# Patient Record
Sex: Female | Born: 1975
Health system: Southern US, Community
[De-identification: ages and names within clinical notes are randomized; demographics above are authoritative.]

## PROBLEM LIST (undated history)

## (undated) DIAGNOSIS — N938 Other specified abnormal uterine and vaginal bleeding: Secondary | ICD-10-CM

## (undated) DIAGNOSIS — L709 Acne, unspecified: Secondary | ICD-10-CM

## (undated) DIAGNOSIS — E669 Obesity, unspecified: Secondary | ICD-10-CM

## (undated) DIAGNOSIS — T7840XA Allergy, unspecified, initial encounter: Secondary | ICD-10-CM

## (undated) HISTORY — DX: Obesity, unspecified: E66.9

## (undated) HISTORY — DX: Acne, unspecified: L70.9

## (undated) HISTORY — PX: TYMPANOSTOMY TUBE PLACEMENT: SHX32

## (undated) HISTORY — DX: Other specified abnormal uterine and vaginal bleeding: N93.8

## (undated) HISTORY — PX: TONSILLECTOMY AND ADENOIDECTOMY: SUR1326

## (undated) HISTORY — DX: Allergy, unspecified, initial encounter: T78.40XA

---

## 1999-10-07 ENCOUNTER — Other Ambulatory Visit: Admission: RE | Admit: 1999-10-07 | Discharge: 1999-10-07 | Payer: Self-pay | Admitting: Family Medicine

## 2001-06-15 ENCOUNTER — Other Ambulatory Visit: Admission: RE | Admit: 2001-06-15 | Discharge: 2001-06-15 | Payer: Self-pay | Admitting: Family Medicine

## 2002-11-30 ENCOUNTER — Other Ambulatory Visit: Admission: RE | Admit: 2002-11-30 | Discharge: 2002-11-30 | Payer: Self-pay | Admitting: Family Medicine

## 2003-11-30 ENCOUNTER — Other Ambulatory Visit: Admission: RE | Admit: 2003-11-30 | Discharge: 2003-11-30 | Payer: Self-pay | Admitting: Family Medicine

## 2005-08-05 ENCOUNTER — Ambulatory Visit: Payer: Self-pay | Admitting: Family Medicine

## 2005-08-26 ENCOUNTER — Ambulatory Visit: Payer: Self-pay | Admitting: Family Medicine

## 2005-09-02 ENCOUNTER — Ambulatory Visit: Payer: Self-pay | Admitting: Family Medicine

## 2005-09-05 ENCOUNTER — Ambulatory Visit: Payer: Self-pay | Admitting: Family Medicine

## 2005-10-28 ENCOUNTER — Ambulatory Visit: Payer: Self-pay | Admitting: Family Medicine

## 2005-11-04 ENCOUNTER — Other Ambulatory Visit: Admission: RE | Admit: 2005-11-04 | Discharge: 2005-11-04 | Payer: Self-pay | Admitting: Family Medicine

## 2005-11-04 ENCOUNTER — Encounter: Payer: Self-pay | Admitting: Family Medicine

## 2005-11-04 ENCOUNTER — Ambulatory Visit: Payer: Self-pay | Admitting: Family Medicine

## 2005-11-12 ENCOUNTER — Emergency Department (HOSPITAL_COMMUNITY): Admission: EM | Admit: 2005-11-12 | Discharge: 2005-11-12 | Payer: Self-pay | Admitting: Emergency Medicine

## 2005-11-27 ENCOUNTER — Ambulatory Visit: Payer: Self-pay | Admitting: Family Medicine

## 2007-05-06 ENCOUNTER — Ambulatory Visit: Payer: Self-pay | Admitting: Family Medicine

## 2007-08-18 ENCOUNTER — Telehealth: Payer: Self-pay | Admitting: Family Medicine

## 2007-08-24 ENCOUNTER — Ambulatory Visit: Payer: Self-pay | Admitting: Family Medicine

## 2007-08-24 DIAGNOSIS — L708 Other acne: Secondary | ICD-10-CM | POA: Insufficient documentation

## 2007-08-24 DIAGNOSIS — J309 Allergic rhinitis, unspecified: Secondary | ICD-10-CM | POA: Insufficient documentation

## 2007-08-24 DIAGNOSIS — F411 Generalized anxiety disorder: Secondary | ICD-10-CM | POA: Insufficient documentation

## 2007-10-05 ENCOUNTER — Ambulatory Visit: Payer: Self-pay | Admitting: Family Medicine

## 2010-01-24 ENCOUNTER — Ambulatory Visit: Payer: Self-pay | Admitting: Family Medicine

## 2010-01-24 LAB — CONVERTED CEMR LAB
AST: 13 units/L (ref 0–37)
Alkaline Phosphatase: 74 units/L (ref 39–117)
BUN: 8 mg/dL (ref 6–23)
Bilirubin Urine: NEGATIVE
Blood in Urine, dipstick: NEGATIVE
CO2: 29 meq/L (ref 19–32)
Calcium: 9.2 mg/dL (ref 8.4–10.5)
Direct LDL: 157.3 mg/dL
Eosinophils Absolute: 0.1 10*3/uL (ref 0.0–0.7)
GFR calc non Af Amer: 88.24 mL/min (ref >60)
Glucose, Bld: 93 mg/dL (ref 70–99)
HDL: 34.8 mg/dL — ABNORMAL LOW (ref >39.00)
Hemoglobin: 14.2 g/dL (ref 12.0–15.0)
Ketones, urine, test strip: NEGATIVE
Lymphs Abs: 3.6 10*3/uL (ref 0.7–4.0)
Monocytes Absolute: 0.8 10*3/uL (ref 0.1–1.0)
Monocytes Relative: 6.4 % (ref 3.0–12.0)
Neutrophils Relative %: 62.1 % (ref 43.0–77.0)
RBC: 4.73 M/uL (ref 3.87–5.11)
Specific Gravity, Urine: 1.01
TSH: 0.92 microintl units/mL (ref 0.35–5.50)
Total Bilirubin: 0.4 mg/dL (ref 0.3–1.2)
Triglycerides: 156 mg/dL — ABNORMAL HIGH (ref 0.0–149.0)
Urobilinogen, UA: 0.2
pH: 6.5

## 2010-01-31 ENCOUNTER — Ambulatory Visit: Payer: Self-pay | Admitting: Family Medicine

## 2010-05-15 ENCOUNTER — Telehealth: Payer: Self-pay | Admitting: Internal Medicine

## 2010-05-16 ENCOUNTER — Ambulatory Visit: Payer: Self-pay | Admitting: Family Medicine

## 2010-05-16 DIAGNOSIS — J029 Acute pharyngitis, unspecified: Secondary | ICD-10-CM | POA: Insufficient documentation

## 2010-05-16 DIAGNOSIS — L259 Unspecified contact dermatitis, unspecified cause: Secondary | ICD-10-CM | POA: Insufficient documentation

## 2010-09-19 HISTORY — PX: CARPAL TUNNEL RELEASE: SHX101

## 2010-10-03 ENCOUNTER — Ambulatory Visit
Admission: RE | Admit: 2010-10-03 | Discharge: 2010-10-03 | Payer: Self-pay | Source: Home / Self Care | Attending: Orthopedic Surgery | Admitting: Orthopedic Surgery

## 2010-11-19 NOTE — Assessment & Plan Note (Signed)
Summary: cpx/no pap/njr PT RSC/NJR   Vital Signs:  Patient profile:   35 year old female Height:      68 inches (172.72 cm) Weight:      271 pounds (123.18 kg) BMI:     41.35 Temp:     98.1 degrees F (36.72 degrees C) oral BP sitting:   120 / 80  (left arm) Cuff size:   large  Vitals Entered By: Kern Reap CMA Duncan Dull) (January 31, 2010 10:39 AM) CC: cpx with no pap   CC:  cpx with no pap.  History of Present Illness: Erika Mullins is a 35 year old single female, nonsmoker, who comes in today for physical exam.  Her current weight is 271.  She's lost 17 pounds by going to Weight Watchers.  Her goal weight I think would be reasonably between 160 and 180.  She takes Celexa 30 mg nightly and feels well,  She's also restarted her BCP because of dysfunctional uterine bleeding.  Pap by GYN.  Seasonal flu 2010.   sure 2000 he seasonal flu 2010  Allergies: 1)  ! Doxycycline 2)  ! * Nickel  Past History:  Past medical, surgical, family and social histories (including risk factors) reviewed, and no changes noted (except as noted below).  Past Medical History: Reviewed history from 08/24/2007 and no changes required. adult acne bilateral PE tubes T/a dysfunctional uterine bleeding obesity  Family History: Reviewed history from 10/05/2007 and no changes required. Family History High cholesterol Family History Hypertension obesity  Social History: Reviewed history from 08/24/2007 and no changes required. 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 No apparent alopecia or balding. Eyes:  No corneal or conjunctival inflammation noted. EOMI. Perrla. Funduscopic  exam benign, without hemorrhages, exudates or papilledema. Vision grossly normal. Ears:  External ear exam shows no significant lesions or deformities.  Otoscopic examination reveals clear canals, tympanic membranes are intact bilaterally without bulging, retraction, inflammation or discharge. Hearing is grossly normal bilaterally. Nose:  External nasal examination shows no deformity or inflammation. Nasal mucosa are pink and moist without lesions or exudates. Mouth:  Oral mucosa and oropharynx without lesions or exudates.  Teeth in good repair. Neck:  No deformities, masses, or tenderness noted. Chest Wall:  No deformities, masses, or tenderness noted. Breasts:  No mass, nodules, thickening, tenderness, bulging, retraction, inflamation, nipple discharge or skin changes noted.   Lungs:  Normal respiratory effort, chest expands symmetrically. Lungs are clear to auscultation, no crackles or wheezes. Heart:  Normal rate and regular rhythm. S1 and S2 normal without gallop, murmur, click, rub or other extra sounds. Abdomen:  Bowel sounds positive,abdomen soft and non-tender without masses, organomegaly or hernias noted. Msk:  No deformity or scoliosis noted of thoracic or lumbar spine.   Pulses:  R and L carotid,radial,femoral,dorsalis pedis and posterior tibial pulses are full and equal bilaterally Extremities:  No clubbing, cyanosis, edema, or deformity noted with normal full range of motion of all joints.   Neurologic:  No cranial nerve deficits noted. Station and gait are normal. Plantar reflexes are down-going bilaterally. DTRs are symmetrical throughout. Sensory, motor and coordinative functions appear intact. Skin:  Intact without suspicious lesions or rashes.Marland KitchenMarland KitchenMarland KitchenMarland Kitchenpicularis r/l arms Cervical Nodes:  No lymphadenopathy noted Axillary Nodes:  No palpable lymphadenopathy  Inguinal Nodes:  No significant adenopathy Psych:  Cognition and judgment appear intact. Alert and cooperative with normal  attention span and concentration. No apparent delusions, illusions, hallucinations   Impression & Recommendations:  Problem # 1:  Preventive Health Care (ICD-V70.0) Assessment Unchanged  Problem # 2:  ANXIETY STATE, UNSPECIFIED (ICD-300.00) Assessment: Improved  Her updated medication list for this problem includes:    Celexa 20 Mg Tabs (Citalopram hydrobromide) .Marland Kitchen... 0ne and 1/2 qhs  Complete Medication List: 1)  Celexa 20 Mg Tabs (Citalopram hydrobromide) .... 0ne and 1/2 qhs 2)  Septra Ds 800-160 Mg Tabs (Sulfamethoxazole-trimethoprim) .Marland Kitchen.. 1tab  at bedtime for acne 3)  Zovia 1/35e (28) 1-35 Mg-mcg Tabs (Ethynodiol diac-eth estradiol) .... Take 1 tablet by mouth once a day 4)  Mircette 0.15-0.02/0.01 Mg (21/5) Tabs (Desogestrel-ethinyl estradiol)  Patient Instructions: 1)  continue current medication.  Return in one year for follow-up Prescriptions: CELEXA 20 MG  TABS (CITALOPRAM HYDROBROMIDE) 0ne and 1/2 qhs  #150 x 3   Entered and Authorized by:   Roderick Pee MD   Signed by:   Roderick Pee MD on 01/31/2010   Method used:   Electronically to        [facility] [address]       [address]       [place], [place]  [address]       Ph: [phone]       Fax: [phone]   RxID:   [id number]

## 2010-11-19 NOTE — Assessment & Plan Note (Signed)
Summary: sore throat - rv   Vital Signs:  Patient profile:   35 year old female Temp:     98.0 degrees F oral BP sitting:   110 / 70  (left arm) Cuff size:   large  Vitals Entered By: Sid Falcon LPN (May 16, 2010 1:44 PM) CC: body aches, swollen glands   History of Present Illness: Patient seen with sore throat and bodyaches for the past week. She has some diffuse mild headaches. No fever. Minimal nasal congestion. No cough. She has not taken anything for symptomatic relief.  Patient also mentions recurrent pruritic rash left wrist. History of allergies to nickel. She has used steroid cream the past and requests refill  Allergies: 1)  ! Doxycycline 2)  ! * Nickel  Past History:  Past Medical History: Last updated: 08/24/2007 adult acne bilateral PE tubes T/a dysfunctional uterine bleeding obesity PMH reviewed for relevance  Review of Systems      See HPI  Physical Exam  General:  Well-developed,well-nourished,in no acute distress; alert,appropriate and cooperative throughout examination Ears:  External ear exam shows no significant lesions or deformities.  Otoscopic examination reveals clear canals, tympanic membranes are intact bilaterally without bulging, retraction, inflammation or discharge. Hearing is grossly normal bilaterally. Nose:  External nasal examination shows no deformity or inflammation. Nasal mucosa are pink and moist without lesions or exudates. Mouth:  patient has a few small scattered aphthous type ulcers posterior pharynx with mild erythema. No significant exudate Neck:  supple with minimal anterior cervical adenopathy Lungs:  Normal respiratory effort, chest expands symmetrically. Lungs are clear to auscultation, no crackles or wheezes. Heart:  Normal rate and regular rhythm. S1 and S2 normal without gallop, murmur, click, rub or other extra sounds. Skin:  patient has nonspecific small erythematous patch left wrist which is nonscaly.  Nonvesicular. Nontender   Impression & Recommendations:  Problem # 1:  SORE THROAT (ICD-462) Assessment New rapid strep is neg. Suspect viral .  treat symptomatically. The following medications were removed from the medication list:    Septra Ds 800-160 Mg Tabs (Sulfamethoxazole-trimethoprim) .Marland Kitchen... 1tab  at bedtime for acne  Orders: Rapid Strep ([address])  Problem # 2:  DERMATITIS, ALLERGIC (ICD-692.9) steroid cream. Her updated medication list for this problem includes:    Triamcinolone Acetonide 0.1 % Crea (Triamcinolone acetonide) .Marland Kitchen... Apply to affected rash two times a day prn  Complete Medication List: 1)  Celexa 20 Mg Tabs (Citalopram hydrobromide) .... 0ne and 1/2 qhs 2)  Mircette 0.15-0.02/0.01 Mg (21/5) Tabs (Desogestrel-ethinyl estradiol) 3)  Triamcinolone Acetonide 0.1 % Crea (Triamcinolone acetonide) .... Apply to affected rash two times a day prn  Patient Instructions: 1)  Treat sore throat symptomatically with Advil, Alleve, or Tylenol. Prescriptions: TRIAMCINOLONE ACETONIDE 0.1 % CREA (TRIAMCINOLONE ACETONIDE) apply to affected rash two times a day prn  #30 gm x 1   Entered and Authorized by:   Evelena Peat MD   Signed by:   Evelena Peat MD on 05/16/2010   Method used:   Electronically to        [facility]       [address]       [place], [place]  [address]       Ph: [phone]       Fax: [phone]   RxID:   [id number]

## 2010-11-19 NOTE — Progress Notes (Signed)
Summary: pt wants nurse to call back re: symptoms  Phone Note Call from Patient Call back at [phone] cell   Caller: Patient Summary of Call: Pt called and said that her gland on neck are really sore and so is her throat. Pt is fatigued. Pt req to talk to Dr Barbette Or nurse.      Initial call taken by: Lucy Antigua,  May 15, 2010 4:42 PM  Follow-up for Phone Call        patient coming in for an office visit Follow-up by: Kern Reap CMA Duncan Dull),  May 16, 2010 8:06 AM

## 2010-12-30 LAB — POCT HEMOGLOBIN-HEMACUE: Hemoglobin: 15 g/dL (ref 12.0–15.0)

## 2011-01-16 ENCOUNTER — Other Ambulatory Visit: Payer: Self-pay

## 2011-01-23 ENCOUNTER — Ambulatory Visit: Payer: Self-pay | Admitting: Family Medicine

## 2011-02-21 ENCOUNTER — Other Ambulatory Visit: Payer: Self-pay | Admitting: *Deleted

## 2011-02-21 MED ORDER — CITALOPRAM HYDROBROMIDE 20 MG PO TABS: 20.0000 mg | ORAL_TABLET | Freq: Every day | ORAL | Status: DC

## 2011-03-06 ENCOUNTER — Other Ambulatory Visit: Payer: Self-pay

## 2011-03-27 ENCOUNTER — Ambulatory Visit: Payer: Self-pay | Admitting: Family Medicine

## 2011-04-08 ENCOUNTER — Other Ambulatory Visit: Payer: Self-pay

## 2011-04-15 ENCOUNTER — Ambulatory Visit: Payer: Self-pay | Admitting: Family Medicine

## 2011-05-21 ENCOUNTER — Other Ambulatory Visit: Payer: Self-pay

## 2011-06-03 ENCOUNTER — Ambulatory Visit: Payer: Self-pay | Admitting: Family Medicine

## 2011-06-30 ENCOUNTER — Ambulatory Visit (INDEPENDENT_AMBULATORY_CARE_PROVIDER_SITE_OTHER): Payer: 59 | Admitting: Family Medicine

## 2011-06-30 ENCOUNTER — Encounter: Payer: Self-pay | Admitting: Family Medicine

## 2011-06-30 VITALS — Temp 97.5°F | Wt 286.0 lb

## 2011-06-30 DIAGNOSIS — Z0000 Encounter for general adult medical examination without abnormal findings: Secondary | ICD-10-CM

## 2011-06-30 DIAGNOSIS — E669 Obesity, unspecified: Secondary | ICD-10-CM

## 2011-06-30 DIAGNOSIS — M549 Dorsalgia, unspecified: Secondary | ICD-10-CM

## 2011-06-30 DIAGNOSIS — Z23 Encounter for immunization: Secondary | ICD-10-CM

## 2011-06-30 MED ORDER — DESOGESTREL-ETHINYL ESTRADIOL 0.15-0.02/0.01 MG (21/5) PO TABS: 1.0000 | ORAL_TABLET | Freq: Every day | ORAL | Status: DC

## 2011-06-30 MED ORDER — TRAMADOL HCL 50 MG PO TABS: ORAL_TABLET | ORAL | Status: DC

## 2011-06-30 MED ORDER — CYCLOBENZAPRINE HCL 5 MG PO TABS: ORAL_TABLET | ORAL | Status: DC

## 2011-06-30 NOTE — Patient Instructions (Signed)
Take 600 mg of Motrin twice daily.  Flexeril and ultram........... One half to one tablet each at bedtime as needed for severe pain.  Begin Weight Watchers.  We will get she is set up to see Jeanene Erb, physical therapist.  Return in 3 weeks for a 30 minute appointment

## 2011-06-30 NOTE — Progress Notes (Signed)
  Subjective:    Patient ID: Erika Mullins, female    DOB: Mar 23, 1976, 35 y.o.   MRN: [id number]  HPI Erika Mullins is a 35 year old single female, nonsmoker, who comes in today for evaluation of low back pain.  She states a week ago.  She awoke with low back pain.  She describes it as a sharp pain.  A6 on a scale of one to 10 it radiates down her posterior right thigh just above the knee.  It's increased by sitting.  The pain does somewhat come and go.  She's never had a history of trauma.  She is overweight 286 pounds.   Review of Systems General and neurologic review of systems otherwise negative    Objective:   Physical Exam  Well-developed well-nourished, female, overweight, in no acute distress.  Examination of lower extremities shows the legs to be of equal length.  Sensation muscle strength reflexes.  Straight leg raising.  All negative.      Assessment & Plan:  Lumbar disk disease.  Plan diet, exercise, physical therapy, Motrin, Flexeril, and Vicodin nightly p.r.n. Follow-up CPX in 3 weeks

## 2011-07-23 ENCOUNTER — Encounter: Payer: Self-pay | Admitting: Family Medicine

## 2011-07-24 ENCOUNTER — Other Ambulatory Visit (HOSPITAL_COMMUNITY)
Admission: RE | Admit: 2011-07-24 | Discharge: 2011-07-24 | Disposition: A | Discharge status: Home / Self Care | Payer: 59 | Source: Ambulatory Visit | Attending: Family Medicine | Admitting: Family Medicine

## 2011-07-24 ENCOUNTER — Ambulatory Visit (INDEPENDENT_AMBULATORY_CARE_PROVIDER_SITE_OTHER): Payer: 59 | Admitting: Family Medicine

## 2011-07-24 ENCOUNTER — Encounter: Payer: Self-pay | Admitting: Family Medicine

## 2011-07-24 DIAGNOSIS — E669 Obesity, unspecified: Secondary | ICD-10-CM

## 2011-07-24 DIAGNOSIS — Z01419 Encounter for gynecological examination (general) (routine) without abnormal findings: Secondary | ICD-10-CM | POA: Insufficient documentation

## 2011-07-24 DIAGNOSIS — M549 Dorsalgia, unspecified: Secondary | ICD-10-CM

## 2011-07-24 DIAGNOSIS — Z0000 Encounter for general adult medical examination without abnormal findings: Secondary | ICD-10-CM

## 2011-07-24 DIAGNOSIS — F411 Generalized anxiety disorder: Secondary | ICD-10-CM

## 2011-07-24 LAB — LIPID PANEL: HDL: 45.1 mg/dL (ref >39.00)

## 2011-07-24 LAB — BASIC METABOLIC PANEL
BUN: 10 mg/dL (ref 6–23)
CO2: 23 mEq/L (ref 19–32)
Calcium: 9 mg/dL (ref 8.4–10.5)
Creatinine, Ser: 0.7 mg/dL (ref 0.4–1.2)
GFR: 103.7 mL/min (ref >60.00)
Glucose, Bld: 100 mg/dL — ABNORMAL HIGH (ref 70–99)
Sodium: 138 mEq/L (ref 135–145)

## 2011-07-24 LAB — CBC WITH DIFFERENTIAL/PLATELET
Eosinophils Absolute: 0.2 10*3/uL (ref 0.0–0.7)
HCT: 39.5 % (ref 36.0–46.0)
Hemoglobin: 13.2 g/dL (ref 12.0–15.0)
Lymphs Abs: 3.1 10*3/uL (ref 0.7–4.0)
Monocytes Absolute: 0.6 10*3/uL (ref 0.1–1.0)
Neutro Abs: 6.8 10*3/uL (ref 1.4–7.7)
Platelets: 508 10*3/uL — ABNORMAL HIGH (ref 150.0–400.0)
RBC: 4.55 Mil/uL (ref 3.87–5.11)
RDW: 12.9 % (ref 11.5–14.6)

## 2011-07-24 LAB — HEPATIC FUNCTION PANEL
ALT: 13 U/L (ref 0–35)
Albumin: 3.8 g/dL (ref 3.5–5.2)
Bilirubin, Direct: 0 mg/dL (ref 0.0–0.3)

## 2011-07-24 MED ORDER — NORETHIN-ETH ESTRAD TRIPHASIC 0.5/0.75/1-35 MG-MCG PO TABS: 1.0000 | ORAL_TABLET | Freq: Every day | ORAL | Status: DC

## 2011-07-24 MED ORDER — TRAMADOL HCL 50 MG PO TABS: ORAL_TABLET | ORAL | Status: DC

## 2011-07-24 MED ORDER — CITALOPRAM HYDROBROMIDE 20 MG PO TABS: 20.0000 mg | ORAL_TABLET | Freq: Every day | ORAL | Status: DC

## 2011-07-24 MED ORDER — TRIAMCINOLONE ACETONIDE 0.1 % EX CREA: TOPICAL_CREAM | Freq: Two times a day (BID) | CUTANEOUS | Status: DC

## 2011-07-24 MED ORDER — CYCLOBENZAPRINE HCL 5 MG PO TABS: ORAL_TABLET | ORAL | Status: DC

## 2011-07-24 NOTE — Progress Notes (Signed)
Addended by: Kern Reap B on: 07/24/2011 11:53 AM   Modules accepted: Orders

## 2011-07-24 NOTE — Patient Instructions (Signed)
Continue your current medications.  Consider a walking program............ Weight Watchers........[facility] Any program the might be valuable to help with your weight.  Return in one year, sooner if any problems

## 2011-07-24 NOTE — Progress Notes (Signed)
  Subjective:    Patient ID: Erika Mullins, female    DOB: 20-Dec-1975, 35 y.o.   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  Doing well.  She is currently taking Flexeril and Ultram at bedtime for back pain.  She is doing physical therapy at home.  She uses BCPs for dysfunction uterine bleeding.  LMP 94 normal.  Weight is 285 pounds.  We have previously discussed diet, exercise and weight loss.  She gets routine eye care, dental care, BSE monthly, tetanus, 2012, flu shot at work   Review of Systems  Constitutional: Negative.   HENT: Negative.   Eyes: Negative.   Respiratory: Negative.   Cardiovascular: Negative.   Gastrointestinal: Negative.   Genitourinary: Negative.   Musculoskeletal: Negative.   Neurological: Negative.   Hematological: Negative.   Psychiatric/Behavioral: Negative.        Objective:   Physical Exam  Constitutional: She appears well-developed and well-nourished.  HENT:  Head: Normocephalic and atraumatic.  Right Ear: External ear normal.  Left Ear: External ear normal.  Nose: Nose normal.  Mouth/Throat: Oropharynx is clear and moist.  Eyes: EOM are normal. Pupils are equal, round, and reactive to light.  Neck: Normal range of motion. Neck supple. No thyromegaly present.  Cardiovascular: Normal rate, regular rhythm, normal heart sounds and intact distal pulses.  Exam reveals no gallop and no friction rub.   No murmur heard. Pulmonary/Chest: Effort normal and breath sounds normal.  Abdominal: Soft. Bowel sounds are normal. She exhibits no distension and no mass. There is no tenderness. There is no rebound.  Genitourinary: Vagina normal and uterus normal. No vaginal discharge found.       Bilateral breast exam normal  Musculoskeletal: Normal range of motion.    Lymphadenopathy:    She has no cervical adenopathy.  Neurological: She is alert. She has normal reflexes. No cranial nerve deficit. She exhibits normal muscle tone. Coordination normal.  Skin: Skin is warm and dry.  Psychiatric: She has a normal mood and affect. Her behavior is normal. Judgment and thought content normal.          Assessment & Plan:  Healthy female.  Mild depression continue Celexa 20 mg nightly  Low back pain continue medication and exercise.  Obesity recommended again trying Weight Watchers.  Dysfunction uterine bleeding.  Continue BCPs

## 2011-12-01 ENCOUNTER — Ambulatory Visit (INDEPENDENT_AMBULATORY_CARE_PROVIDER_SITE_OTHER): Payer: 59 | Admitting: Family Medicine

## 2011-12-01 ENCOUNTER — Encounter: Payer: Self-pay | Admitting: Family Medicine

## 2011-12-01 VITALS — BP 110/80 | Temp 98.0°F | Wt 284.0 lb

## 2011-12-01 DIAGNOSIS — J309 Allergic rhinitis, unspecified: Secondary | ICD-10-CM

## 2011-12-01 MED ORDER — FLUTICASONE PROPIONATE 50 MCG/ACT NA SUSP: NASAL | Status: DC

## 2011-12-01 MED ORDER — PREDNISONE 20 MG PO TABS: ORAL_TABLET | ORAL | Status: DC

## 2011-12-01 NOTE — Patient Instructions (Signed)
Take the prednisone as directed  When u  Finish  the prednisone begin plain Zyrtec 10 mg at bedtime along with one shot of the steroid nasal spray at bedtime  Remember to do all the environmental things that we discussed

## 2011-12-01 NOTE — Progress Notes (Signed)
  Subjective:    Patient ID: Erika Mullins, female    DOB: Jan 07, 1976, 36 y.o.   MRN: [id number]  HPI Who comes in today for evaluation of allergic rhinitis  For the past 2 weeks she said fatigue no energy her condition postnasal drip no cough no wheezing.  At home she has dogs that sleep in her bed she also has a feather pillow and will blankets and a down comforter     Review of Systems General and ENT review of systems otherwise negative    Objective:   Physical Exam Well-developed well-nourished female in no acute distress HEENT negative neck was supple no adenopathy lungs are clear       Assessment & Plan:  Allergic rhinitis plan short course of prednisone then Zyrtec and steroid nasal spray each bedtime also discussed eliminating potential environmental triggers

## 2012-07-04 ENCOUNTER — Other Ambulatory Visit: Payer: Self-pay | Admitting: Family Medicine

## 2012-07-05 ENCOUNTER — Telehealth: Payer: Self-pay | Admitting: Family Medicine

## 2012-07-05 NOTE — Telephone Encounter (Signed)
Patient called stating that she need a refill of her norethindrone-ethinyl estradiol called into [facility] [address]. Please assist.

## 2012-07-05 NOTE — Telephone Encounter (Signed)
Rx already sent.

## 2012-07-06 ENCOUNTER — Telehealth: Payer: Self-pay | Admitting: Family Medicine

## 2012-07-06 MED ORDER — ORTHO-NOVUM 7/7/7 (28) 0.5/0.75/1-35 MG-MCG PO TABS: 1.0000 | ORAL_TABLET | Freq: Every day | ORAL | Status: DC

## 2012-07-06 NOTE — Telephone Encounter (Signed)
rx sent

## 2012-07-06 NOTE — Telephone Encounter (Signed)
Patient called stating that per [facility] [address] she has to have the brand name birth control in order for it to be free it has to state MD prefer name brand. Please assist.

## 2012-07-19 ENCOUNTER — Ambulatory Visit (INDEPENDENT_AMBULATORY_CARE_PROVIDER_SITE_OTHER): Payer: 59 | Admitting: Family Medicine

## 2012-07-19 ENCOUNTER — Encounter: Payer: Self-pay | Admitting: Family Medicine

## 2012-07-19 VITALS — BP 110/80 | Temp 98.1°F | Wt 286.0 lb

## 2012-07-19 DIAGNOSIS — T148XXA Other injury of unspecified body region, initial encounter: Secondary | ICD-10-CM

## 2012-07-19 DIAGNOSIS — IMO0002 Reserved for concepts with insufficient information to code with codable children: Secondary | ICD-10-CM

## 2012-07-19 NOTE — Patient Instructions (Signed)
Wear a sterile dressing daily  Cotton socks and sneakers  Return when necessary

## 2012-07-19 NOTE — Progress Notes (Signed)
  Subjective:    Patient ID: Erika Mullins, female    DOB: 1976-07-13, 36 y.o.   MRN: [id number]  HPI Erika Mullins is a 36 year old female who comes in today for evaluation of an abrasion on the dorsum of her right foot x5 days     Review of Systems    general and dermatologic review of systems negative Objective:   Physical Exam Well-developed and nourished female no acute distress examination of the foot shows a dime size abrasion dorsum of the foot just proximal to the great toe not infected       Assessment & Plan:  Abrasion right foot advised symptomatic therapy

## 2012-08-24 ENCOUNTER — Other Ambulatory Visit: Payer: 59

## 2012-08-31 ENCOUNTER — Encounter: Payer: 59 | Admitting: Family Medicine

## 2012-09-14 ENCOUNTER — Other Ambulatory Visit (INDEPENDENT_AMBULATORY_CARE_PROVIDER_SITE_OTHER): Payer: 59

## 2012-09-14 DIAGNOSIS — Z0000 Encounter for general adult medical examination without abnormal findings: Secondary | ICD-10-CM

## 2012-09-14 LAB — CBC WITH DIFFERENTIAL/PLATELET
Basophils Absolute: 0 10*3/uL (ref 0.0–0.1)
Basophils Relative: 0.3 % (ref 0.0–3.0)
HCT: 40.4 % (ref 36.0–46.0)
Hemoglobin: 13.3 g/dL (ref 12.0–15.0)
MCV: 86.8 fl (ref 78.0–100.0)
Monocytes Absolute: 0.9 10*3/uL (ref 0.1–1.0)
Monocytes Relative: 6.3 % (ref 3.0–12.0)
Neutro Abs: 8.2 10*3/uL — ABNORMAL HIGH (ref 1.4–7.7)

## 2012-09-14 LAB — HEPATIC FUNCTION PANEL
ALT: 12 U/L (ref 0–35)
AST: 15 U/L (ref 0–37)
Alkaline Phosphatase: 76 U/L (ref 39–117)
Bilirubin, Direct: 0 mg/dL (ref 0.0–0.3)
Total Bilirubin: 0.6 mg/dL (ref 0.3–1.2)

## 2012-09-14 LAB — POCT URINALYSIS DIPSTICK
Bilirubin, UA: NEGATIVE
Blood, UA: NEGATIVE
Glucose, UA: NEGATIVE
Nitrite, UA: NEGATIVE
Urobilinogen, UA: 0.2

## 2012-09-14 LAB — BASIC METABOLIC PANEL
Creatinine, Ser: 0.7 mg/dL (ref 0.4–1.2)
GFR: 103 mL/min (ref >60.00)
Glucose, Bld: 104 mg/dL — ABNORMAL HIGH (ref 70–99)
Potassium: 4.3 mEq/L (ref 3.5–5.1)

## 2012-09-14 LAB — LIPID PANEL: VLDL: 57 mg/dL — ABNORMAL HIGH (ref 0.0–40.0)

## 2012-09-14 LAB — LDL CHOLESTEROL, DIRECT: Direct LDL: 163.6 mg/dL

## 2012-09-17 ENCOUNTER — Encounter: Payer: Self-pay | Admitting: Family Medicine

## 2012-09-17 ENCOUNTER — Other Ambulatory Visit (HOSPITAL_COMMUNITY)
Admission: RE | Admit: 2012-09-17 | Discharge: 2012-09-17 | Disposition: A | Discharge status: Home / Self Care | Payer: 59 | Source: Ambulatory Visit | Attending: Family Medicine | Admitting: Family Medicine

## 2012-09-17 ENCOUNTER — Ambulatory Visit (INDEPENDENT_AMBULATORY_CARE_PROVIDER_SITE_OTHER): Payer: 59 | Admitting: Family Medicine

## 2012-09-17 VITALS — BP 124/80 | Temp 97.3°F | Ht 68.0 in | Wt 288.0 lb

## 2012-09-17 DIAGNOSIS — E669 Obesity, unspecified: Secondary | ICD-10-CM

## 2012-09-17 DIAGNOSIS — Z01419 Encounter for gynecological examination (general) (routine) without abnormal findings: Secondary | ICD-10-CM

## 2012-09-17 DIAGNOSIS — L259 Unspecified contact dermatitis, unspecified cause: Secondary | ICD-10-CM

## 2012-09-17 DIAGNOSIS — F411 Generalized anxiety disorder: Secondary | ICD-10-CM

## 2012-09-17 DIAGNOSIS — F419 Anxiety disorder, unspecified: Secondary | ICD-10-CM | POA: Insufficient documentation

## 2012-09-17 DIAGNOSIS — M549 Dorsalgia, unspecified: Secondary | ICD-10-CM

## 2012-09-17 MED ORDER — CITALOPRAM HYDROBROMIDE 20 MG PO TABS: 20.0000 mg | ORAL_TABLET | Freq: Every day | ORAL | Status: DC

## 2012-09-17 MED ORDER — ORTHO-NOVUM 7/7/7 (28) 0.5/0.75/1-35 MG-MCG PO TABS: 1.0000 | ORAL_TABLET | Freq: Every day | ORAL | Status: DC

## 2012-09-17 MED ORDER — TRIAMCINOLONE ACETONIDE 0.1 % EX CREA: TOPICAL_CREAM | Freq: Two times a day (BID) | CUTANEOUS | Status: DC

## 2012-09-17 NOTE — Patient Instructions (Signed)
Continue your current medications  Return in one year sooner if any problems 

## 2012-09-17 NOTE — Progress Notes (Signed)
  Subjective:    Patient ID: Erika Mullins, female    DOB: [birth year], 36 y.o.   MRN: [id number]  HPI Erika Mullins is a 36 year old single female nonsmoker who comes in today for annual physical examination  She takes 20 mg of Celexa at bedtime for a history of mild anxiety  She takes her BCPs periods are normal  He's is Kenalog when necessary for eczema  She states she's had a good year no problems. Tetanus booster 2012 seasonal flu shot 2013. She does have a history of fibrocystic breast changes and does BSE monthly   Review of Systems  Constitutional: Negative.   HENT: Negative.   Eyes: Negative.   Respiratory: Negative.   Cardiovascular: Negative.   Gastrointestinal: Negative.   Genitourinary: Negative.   Musculoskeletal: Negative.   Neurological: Negative.   Hematological: Negative.   Psychiatric/Behavioral: Negative.       general and metabolic review of systems otherwise negative Objective:   Physical Exam  Constitutional: She appears well-developed and well-nourished.  HENT:  Head: Normocephalic and atraumatic.  Right Ear: External ear normal.  Left Ear: External ear normal.  Nose: Nose normal.  Mouth/Throat: Oropharynx is clear and moist.  Eyes: EOM are normal. Pupils are equal, round, and reactive to light.  Neck: Normal range of motion. Neck supple. No thyromegaly present.  Cardiovascular: Normal rate, regular rhythm, normal heart sounds and intact distal pulses.  Exam reveals no gallop and no friction rub.   No murmur heard. Pulmonary/Chest: Effort normal and breath sounds normal.  Abdominal: Soft. Bowel sounds are normal. She exhibits no distension and no mass. There is no tenderness. There is no rebound.  Genitourinary: Vagina normal and uterus normal. Guaiac negative stool. No vaginal discharge found.       Bilateral breast exam normal except for multiple small BB size soft rubbery movable fibrocystic changes  Musculoskeletal: Normal range of motion.    Lymphadenopathy:    She has no cervical adenopathy.  Neurological: She is alert. She has normal reflexes. No cranial nerve deficit. She exhibits normal muscle tone. Coordination normal.  Skin: Skin is warm and dry.  Psychiatric: She has a normal mood and affect. Her behavior is normal. Judgment and thought content normal.          Assessment & Plan:  Healthy female  Continue Celexa  Continue BCPs  Continue triamcinolone when necessary  Diet and exercise

## 2012-10-05 ENCOUNTER — Ambulatory Visit: Payer: 59 | Admitting: *Deleted

## 2012-10-08 ENCOUNTER — Other Ambulatory Visit: Payer: 59

## 2012-10-18 ENCOUNTER — Encounter: Payer: 59 | Admitting: Family Medicine

## 2012-10-26 ENCOUNTER — Encounter: Payer: 59 | Attending: Family Medicine | Admitting: *Deleted

## 2012-10-26 ENCOUNTER — Encounter: Payer: Self-pay | Admitting: *Deleted

## 2012-10-26 VITALS — Ht 69.0 in | Wt 289.2 lb

## 2012-10-26 DIAGNOSIS — Z713 Dietary counseling and surveillance: Secondary | ICD-10-CM | POA: Insufficient documentation

## 2012-10-26 DIAGNOSIS — E669 Obesity, unspecified: Secondary | ICD-10-CM

## 2012-10-26 NOTE — Progress Notes (Signed)
  Medical Nutrition Therapy:  Appt start time: 1200 end time:  1300.  Assessment:: Obesity (278.00).  Erika Mullins comes today for MNT and help with weight loss. Dietary recall reveals excessive calories, CHO, and fat intake through sweets, starches, and meals away from home. Has not exercised recently d/t illness.  States she knows what she needs to do, but just needs a plan.    MEDICATIONS: Celexa, BCP, Kenalog   TANITA  BODY COMPOSITION RESULTS  10/26/12   BMI (kg/m^2) 42.8   Fat Mass (lbs) 159.0   Fat Free Mass (lbs) 131.0   Total Body Water (lbs) 96.0   DIETARY INTAKE:  Usual eating pattern includes 3 meals and 0-1 snacks per day.  24-hr recall:  B (8:30 AM): [employer] Bacon, egg, gouda sandwich w/ skinny hazelnut latte  Snk (AM): NONE L (12 PM): Chili w/ cheese, large banana, a snowball (choc & coconut sweet); water Snk (PM): Chips or candy bar (2-3 days/week) D (PM): Chipotle chicken & rice bowl w/ sour cream, guac, and chips Snk (PM): 1 pc of candy or lg glass of wine (3-4x/week) Beverages: Water, coffee, wine, sometimes regular soda   Usual physical activity:  Was exercising at gym 3 times/week for 2 months; haven't recently d/t illness.  60 min tap class on Wed nights   Estimated daily energy needs: 1500-1600 calories (maint calories = 2000-2100) 185-200 g carbohydrates 70-90 g protein 40-50 g fat  Progress Towards Goal(s):  In progress.   Nutritional Diagnosis:  [place]-3.3 Overweight/obesity related to poor eating habits as evidenced by BMI of 42.7 kg/m^2 and MD referral for MNT.    Intervention:  Nutrition education including CHO metabolism, CHO counting, and meal planning.   Handouts given during visit include:  Meal Planning Card  Low CHO Snack List  30g and 45g CHO Meal Plan  Goals:  Eat 3 meals/day, Avoid meal skipping   Have lean protein with ALL carbohydrates  Limit carbohydrates to 3 "choices" or 45g per meal and 1 "choice" or 15g per snack  Choose  more whole grains, lean protein, low-fat dairy, and fruits/non-starchy vegetables  Aim for >30 min of physical activity daily  Limit/avoid sugar-sweetened beverages and concentrated sweets  Limit alcohol intake to avoid extra calories  Monitoring/Evaluation:  Dietary intake, exercise, and body weight in 2 week(s).

## 2012-10-27 ENCOUNTER — Encounter: Payer: Self-pay | Admitting: *Deleted

## 2012-10-27 NOTE — Patient Instructions (Addendum)
Goals:  Eat 3 meals/day, Avoid meal skipping  Have lean protein with ALL carbohydrates  Limit carbohydrates to 3 "choices" or 45g per meal and 1 "choice" or 15g per snack.    Choose more whole grains, lean protein, low-fat dairy, and fruits/non-starchy vegetables.   Aim for >30 min of physical activity daily  Limit/avoid sugar-sweetened beverages and concentrated sweets  Limit alcohol intake to avoid extra calories.

## 2012-11-22 ENCOUNTER — Encounter: Payer: 59 | Attending: Family Medicine | Admitting: *Deleted

## 2012-11-22 ENCOUNTER — Encounter: Payer: Self-pay | Admitting: *Deleted

## 2012-11-22 VITALS — Ht 69.0 in | Wt 290.5 lb

## 2012-11-22 DIAGNOSIS — E669 Obesity, unspecified: Secondary | ICD-10-CM | POA: Insufficient documentation

## 2012-11-22 DIAGNOSIS — Z713 Dietary counseling and surveillance: Secondary | ICD-10-CM | POA: Insufficient documentation

## 2012-11-22 NOTE — Progress Notes (Signed)
  Medical Nutrition Therapy:  Appt start time:  830   End time:  900.  Primary Concerns Today:  Obesity (278.00).  Erika Mullins comes today for f/u and reports increase in soda intake (3-4 12oz cans). Has decreased meals away from home, however, and making better food choices. Was taking lunch to work until last week and was tracking her intake. Reports she then fell off the wagon. Plan is to restart goals from last visit.   MEDICATIONS:  No changes reported   TANITA  BODY COMPOSITION RESULTS  10/26/12 11/22/12   BMI (kg/m^2) 42.8 42.9   Fat Mass (lbs) 159.0 158.5   Fat Free Mass (lbs) 131.0 132.0   Total Body Water (lbs) 96.0 96.5   DIETARY INTAKE:  Usual eating pattern includes 3 meals and 0-1 snacks per day.  24-hr recall:  B (8:30 AM): [employer] Bacon, egg, gouda sandwich w/ skinny hazelnut latte  Snk (AM): NONE L (12 PM): Salad with [place] or chicken w/ oil & vinegar or blue cheese OR stir fry veggies/steamed brown rice; water Snk (PM): Fruit OR carrots/hummus OR sometimes bag of chips D (PM): Chipotle chicken & rice bowl w/ sour cream, guac, and chips Snk (PM): lg glass of wine (3-4x/week) Beverages: Water, coffee, wine, regular soda   Usual physical activity:  Still no consistent exercise - Was exercising at gym 3 times/week for 2 months; hasn't resumed yet since recent illness. Continues 60 min tap class on Wed nights; looking into starting a Jazz class too.  Estimated daily energy needs: 1500-1600 calories (maint calories = 2000-2100) 185-200 g carbohydrates 70-90 g protein 40-50 g fat  Progress Towards Goal(s):  In progress.   Nutritional Diagnosis:  [place]-3.3 Overweight/obesity related to poor eating habits as evidenced by BMI of 42.7 kg/m^2 and MD referral for MNT.    Intervention:  Nutrition education including CHO metabolism, CHO counting, and meal planning.   Handouts given during visit include:  Low CHO Snack List  30g and 45g CHO Meal Plan  [employer]  Guide  Monitoring/Evaluation:  Dietary intake, exercise, and body weight in 4 week(s).

## 2012-11-22 NOTE — Patient Instructions (Addendum)
Goals:  Eat 3 meals/day, Avoid meal skipping  Have lean protein with ALL carbohydrates  Limit carbohydrates to 3 "choices" or 45g per meal and 1 "choice" or 15g per snack.    Choose more whole grains, lean protein, low-fat dairy, and fruits/non-starchy vegetables.   Aim for >30 min of physical activity daily  Limit/avoid sugar-sweetened beverages and concentrated sweets  Limit alcohol intake to avoid extra calories. 

## 2013-01-21 ENCOUNTER — Emergency Department (HOSPITAL_COMMUNITY)
Admission: EM | Admit: 2013-01-21 | Discharge: 2013-01-21 | Disposition: A | Discharge status: Home / Self Care | Payer: 59 | Attending: Emergency Medicine | Admitting: Emergency Medicine

## 2013-01-21 ENCOUNTER — Encounter (HOSPITAL_COMMUNITY): Payer: Self-pay | Admitting: Emergency Medicine

## 2013-01-21 DIAGNOSIS — S0990XA Unspecified injury of head, initial encounter: Secondary | ICD-10-CM | POA: Insufficient documentation

## 2013-01-21 DIAGNOSIS — S79929A Unspecified injury of unspecified thigh, initial encounter: Secondary | ICD-10-CM | POA: Insufficient documentation

## 2013-01-21 DIAGNOSIS — S99919A Unspecified injury of unspecified ankle, initial encounter: Secondary | ICD-10-CM | POA: Insufficient documentation

## 2013-01-21 DIAGNOSIS — M25561 Pain in right knee: Secondary | ICD-10-CM

## 2013-01-21 DIAGNOSIS — S0003XA Contusion of scalp, initial encounter: Secondary | ICD-10-CM | POA: Insufficient documentation

## 2013-01-21 DIAGNOSIS — Y92009 Unspecified place in unspecified non-institutional (private) residence as the place of occurrence of the external cause: Secondary | ICD-10-CM | POA: Insufficient documentation

## 2013-01-21 DIAGNOSIS — S79919A Unspecified injury of unspecified hip, initial encounter: Secondary | ICD-10-CM | POA: Insufficient documentation

## 2013-01-21 DIAGNOSIS — W19XXXA Unspecified fall, initial encounter: Secondary | ICD-10-CM

## 2013-01-21 DIAGNOSIS — Y9389 Activity, other specified: Secondary | ICD-10-CM | POA: Insufficient documentation

## 2013-01-21 DIAGNOSIS — E669 Obesity, unspecified: Secondary | ICD-10-CM | POA: Insufficient documentation

## 2013-01-21 DIAGNOSIS — S8990XA Unspecified injury of unspecified lower leg, initial encounter: Secondary | ICD-10-CM | POA: Insufficient documentation

## 2013-01-21 DIAGNOSIS — S46909A Unspecified injury of unspecified muscle, fascia and tendon at shoulder and upper arm level, unspecified arm, initial encounter: Secondary | ICD-10-CM | POA: Insufficient documentation

## 2013-01-21 DIAGNOSIS — W010XXA Fall on same level from slipping, tripping and stumbling without subsequent striking against object, initial encounter: Secondary | ICD-10-CM | POA: Insufficient documentation

## 2013-01-21 DIAGNOSIS — Z8742 Personal history of other diseases of the female genital tract: Secondary | ICD-10-CM | POA: Insufficient documentation

## 2013-01-21 DIAGNOSIS — Z79899 Other long term (current) drug therapy: Secondary | ICD-10-CM | POA: Insufficient documentation

## 2013-01-21 DIAGNOSIS — S4980XA Other specified injuries of shoulder and upper arm, unspecified arm, initial encounter: Secondary | ICD-10-CM | POA: Insufficient documentation

## 2013-01-21 DIAGNOSIS — W1809XA Striking against other object with subsequent fall, initial encounter: Secondary | ICD-10-CM | POA: Insufficient documentation

## 2013-01-21 DIAGNOSIS — Z872 Personal history of diseases of the skin and subcutaneous tissue: Secondary | ICD-10-CM | POA: Insufficient documentation

## 2013-01-21 NOTE — ED Provider Notes (Signed)
History    This chart was scribed for non-physician practitioner working with Vida Roller, MD by Leone Payor, ED Scribe. This patient was seen in room WTR6/WTR6 and the patient's care was started at 2125.   CSN: [id number]  Arrival date & time 01/21/13  2125   First MD Initiated Contact with Patient 01/21/13 2150      Chief Complaint  Patient presents with  . Fall     The history is provided by the patient. No language interpreter was used.    Erika Mullins is a 37 y.o. female who presents to the Emergency Department complaining of new, constant, unchanged soreness to head, R knee, R hip and R shoulder after falling down 6 carpet steps in her home about 1.5 hours ago. Pt tripped and fell onto hard wood striking the back of her head. She denies LOC, back pain, HA, neck pain, vision changes, trouble moving jaw, nausea, vomiting. She denies taking OTC pain medication prior to arrival. Pt reports applying ice to her head with some relief. Nothing makes symptoms worse.  Pt is an occasional alcohol user but denies smoking.  Past Medical History  Diagnosis Date  . Adult acne   . Obesity   . Dysfunctional uterine bleeding     Past Surgical History  Procedure Laterality Date  . Tonsillectomy and adenoidectomy    . Tympanostomy tube placement      bilateral  . Carpal tunnel release  09/2010    Right    Family History  Problem Relation Age of Onset  . Hyperlipidemia    . Hypertension    . Obesity    . Hyperlipidemia Father   . Heart disease Father   . Obesity Father   . Stroke Maternal Grandmother   . Cancer Maternal Grandfather     Pancreatic  . Hyperlipidemia Maternal Grandfather   . Heart attack Maternal Grandfather     History  Substance Use Topics  . Smoking status: Never Smoker   . Smokeless tobacco: Not on file  . Alcohol Use: Yes     Comment: large glass of wine 3-4 nights/week    No OB history provided.   Review of Systems  Constitutional: Negative  for fatigue.  HENT: Negative for neck pain and tinnitus.   Eyes: Negative for photophobia, pain and visual disturbance.  Respiratory: Negative for shortness of breath.   Cardiovascular: Negative for chest pain.  Gastrointestinal: Negative for nausea and vomiting.  Musculoskeletal: Positive for arthralgias. Negative for back pain and gait problem.  Skin: Negative for wound.  Neurological: Negative for dizziness, syncope, weakness, light-headedness, numbness and headaches.  Psychiatric/Behavioral: Negative for confusion and decreased concentration.    Allergies  Doxycycline and Nickel  Home Medications   Current Outpatient Rx  Name  Route  Sig  Dispense  Refill  . calcium carbonate (OS-CAL) 600 MG TABS   Oral   Take 600 mg by mouth 2 (two) times daily with a meal.         . citalopram (CELEXA) 20 MG tablet   Oral   Take 20 mg by mouth every morning.          Marland Kitchen ibuprofen (ADVIL,MOTRIN) 200 MG tablet   Oral   Take 600-800 mg by mouth every 6 (six) hours as needed for pain.         Marland Kitchen loperamide (IMODIUM) 2 MG capsule   Oral   Take 2 mg by mouth 2 (two) times daily as needed for diarrhea  or loose stools.         . Multiple Vitamin (MULTIVITAMIN WITH MINERALS) TABS   Oral   Take 1 tablet by mouth every morning.         . ORTHO-NOVUM 7/7/7, 28, 0.5/0.75/1-35 MG-MCG tablet   Oral   Take 1 tablet by mouth daily.   3 Package   3     Dispense as written.    Brand-name only     BP 136/78  Pulse 86  Temp(Src) 98.1 F (36.7 C) (Oral)  Resp 16  Ht 5\' 8"  (1.727 m)  Wt 285 lb (129.275 kg)  BMI 43.34 kg/m2  SpO2 98%  LMP 01/14/2013  Physical Exam  Nursing note and vitals reviewed. Constitutional: She is oriented to person, place, and time. She appears well-developed and well-nourished. No distress.  HENT:  Head: Normocephalic and atraumatic. Head is without raccoon's eyes and without Battle's sign.  Right Ear: Tympanic membrane, external ear and ear canal  normal. No hemotympanum.  Left Ear: Tympanic membrane, external ear and ear canal normal. No hemotympanum.  Nose: Nose normal. No nasal septal hematoma.  Mouth/Throat: Uvula is midline, oropharynx is clear and moist and mucous membranes are normal.  Eyes: Conjunctivae, EOM and lids are normal. Pupils are equal, round, and reactive to light. Right eye exhibits no nystagmus. Left eye exhibits no nystagmus.  No visible hyphema noted  Neck: Normal range of motion. Neck supple. No tracheal deviation present.  Cardiovascular: Normal rate and regular rhythm.   Pulmonary/Chest: Effort normal and breath sounds normal. No respiratory distress.  Abdominal: Soft. There is no tenderness.  Musculoskeletal: Normal range of motion.       Cervical back: She exhibits normal range of motion, no tenderness and no bony tenderness.       Thoracic back: She exhibits no tenderness and no bony tenderness.       Lumbar back: She exhibits no tenderness and no bony tenderness.  Neurological: She is alert and oriented to person, place, and time. She has normal strength and normal reflexes. No cranial nerve deficit or sensory deficit. Coordination normal. GCS eye subscore is 4. GCS verbal subscore is 5. GCS motor subscore is 6.  Skin: Skin is warm and dry.  3 cm circular contusion to the right occipital.   Minimal tenderness over right patella. Full ROM.   Psychiatric: She has a normal mood and affect. Her behavior is normal.    ED Course  Procedures (including critical care time)  DIAGNOSTIC STUDIES: Oxygen Saturation is 98% on room air, normal by my interpretation.    COORDINATION OF CARE: 10:08 PM Discussed treatment plan with pt at bedside and pt agreed to plan.    Labs Reviewed - No data to display No results found.   1. Head injury, initial encounter   2. Scalp contusion, initial encounter   3. Knee pain, acute, right   4. Fall, initial encounter    Patient seen and examined.   Vital signs reviewed  and are as follows: Filed Vitals:   01/21/13 2133  BP: 136/78  Pulse: 86  Temp: 98.1 F (36.7 C)  Resp: 16   Patient was counseled on head injury precautions and symptoms that should indicate their return to the ED.  These include severe worsening headache, vision changes, confusion, loss of consciousness, trouble walking, nausea & vomiting, or weakness/tingling in extremities.       MDM  Patient with minor head injury without loss of consciousness, blurry vision, headache vomiting,  neurological symptoms. She appears well. She has knee pain but full active range of motion and I do not suspect fracture.  Patient counseled on return instructions. Do not suspect significant cranial injury given exam and history.  I personally performed the services described in this documentation, which was scribed in my presence. The recorded information has been reviewed and is accurate.      Renne Crigler, PA-C 01/22/13 [phone]

## 2013-01-21 NOTE — ED Notes (Signed)
Pt c/o fall down 6 carpet steps in her home after tripping over dog, landing on hard wood striking back of head. Pt denies LOC. Pt c/o pain to head, bilat knees, R hip and R shoulder soreness.

## 2013-01-22 NOTE — ED Provider Notes (Signed)
Medical screening examination/treatment/procedure(s) were performed by non-physician practitioner and as supervising physician I was immediately available for consultation/collaboration.    Lexianna Weinrich D Tamotsu Wiederholt, MD 01/22/13 1453 

## 2013-08-30 ENCOUNTER — Other Ambulatory Visit: Payer: Self-pay | Admitting: Family Medicine

## 2013-10-10 ENCOUNTER — Other Ambulatory Visit: Payer: Self-pay | Admitting: Family Medicine

## 2013-12-07 ENCOUNTER — Other Ambulatory Visit: Payer: Self-pay | Admitting: Family Medicine

## 2013-12-20 ENCOUNTER — Other Ambulatory Visit: Payer: 59

## 2013-12-27 ENCOUNTER — Encounter: Payer: 59 | Admitting: Family Medicine

## 2014-01-25 ENCOUNTER — Other Ambulatory Visit: Payer: 59

## 2014-01-31 ENCOUNTER — Encounter: Payer: 59 | Admitting: Family Medicine

## 2014-03-03 ENCOUNTER — Other Ambulatory Visit: Payer: 59

## 2014-03-07 ENCOUNTER — Other Ambulatory Visit: Payer: 59

## 2014-03-09 ENCOUNTER — Encounter: Payer: 59 | Admitting: Family Medicine

## 2014-04-12 ENCOUNTER — Other Ambulatory Visit: Payer: 59

## 2014-04-19 ENCOUNTER — Encounter: Payer: 59 | Admitting: Family Medicine

## 2014-05-30 ENCOUNTER — Other Ambulatory Visit: Payer: Self-pay | Admitting: Family Medicine

## 2014-07-25 ENCOUNTER — Other Ambulatory Visit: Payer: Self-pay | Admitting: Family Medicine

## 2014-09-05 ENCOUNTER — Other Ambulatory Visit: Payer: 59

## 2014-09-08 ENCOUNTER — Other Ambulatory Visit (INDEPENDENT_AMBULATORY_CARE_PROVIDER_SITE_OTHER): Payer: 59

## 2014-09-08 DIAGNOSIS — R790 Abnormal level of blood mineral: Secondary | ICD-10-CM

## 2014-09-08 DIAGNOSIS — Z0000 Encounter for general adult medical examination without abnormal findings: Secondary | ICD-10-CM

## 2014-09-08 LAB — BASIC METABOLIC PANEL
BUN: 7 mg/dL (ref 6–23)
CALCIUM: 9.3 mg/dL (ref 8.4–10.5)
CO2: 26 mEq/L (ref 19–32)
Chloride: 102 mEq/L (ref 96–112)
Creatinine, Ser: 0.7 mg/dL (ref 0.4–1.2)
GFR: 101.85 mL/min (ref >60.00)
GLUCOSE: 97 mg/dL (ref 70–99)
Potassium: 4.6 mEq/L (ref 3.5–5.1)
SODIUM: 137 meq/L (ref 135–145)

## 2014-09-08 LAB — CBC WITH DIFFERENTIAL/PLATELET
Basophils Absolute: 0 10*3/uL (ref 0.0–0.1)
Basophils Relative: 0.3 % (ref 0.0–3.0)
EOS ABS: 0.2 10*3/uL (ref 0.0–0.7)
EOS PCT: 1.4 % (ref 0.0–5.0)
HEMATOCRIT: 43 % (ref 36.0–46.0)
Hemoglobin: 14.4 g/dL (ref 12.0–15.0)
LYMPHS ABS: 4.5 10*3/uL — AB (ref 0.7–4.0)
Lymphocytes Relative: 32.4 % (ref 12.0–46.0)
MCHC: 33.6 g/dL (ref 30.0–36.0)
MCV: 86.3 fl (ref 78.0–100.0)
MONO ABS: 1 10*3/uL (ref 0.1–1.0)
Monocytes Relative: 7.2 % (ref 3.0–12.0)
Neutro Abs: 8.2 10*3/uL — ABNORMAL HIGH (ref 1.4–7.7)
Neutrophils Relative %: 58.7 % (ref 43.0–77.0)
PLATELETS: 485 10*3/uL — AB (ref 150.0–400.0)
RBC: 4.99 Mil/uL (ref 3.87–5.11)
RDW: 12.9 % (ref 11.5–15.5)
WBC: 13.9 10*3/uL — AB (ref 4.0–10.5)

## 2014-09-08 LAB — POCT URINALYSIS DIPSTICK
Bilirubin, UA: NEGATIVE
Glucose, UA: NEGATIVE
Ketones, UA: NEGATIVE
Leukocytes, UA: NEGATIVE
Nitrite, UA: NEGATIVE
PROTEIN UA: NEGATIVE
RBC UA: NEGATIVE
SPEC GRAV UA: 1.01
UROBILINOGEN UA: 0.2
pH, UA: 7

## 2014-09-08 LAB — LDL CHOLESTEROL, DIRECT: Direct LDL: 199.1 mg/dL

## 2014-09-08 LAB — HEPATIC FUNCTION PANEL
ALK PHOS: 79 U/L (ref 39–117)
ALT: 20 U/L (ref 0–35)
AST: 21 U/L (ref 0–37)
Albumin: 4.1 g/dL (ref 3.5–5.2)
BILIRUBIN DIRECT: 0 mg/dL (ref 0.0–0.3)
BILIRUBIN TOTAL: 0.6 mg/dL (ref 0.2–1.2)
Total Protein: 7.2 g/dL (ref 6.0–8.3)

## 2014-09-08 LAB — LIPID PANEL
Cholesterol: 276 mg/dL — ABNORMAL HIGH (ref 0–200)
HDL: 40.3 mg/dL (ref >39.00)
NONHDL: 235.7
Total CHOL/HDL Ratio: 7
Triglycerides: 276 mg/dL — ABNORMAL HIGH (ref 0.0–149.0)
VLDL: 55.2 mg/dL — ABNORMAL HIGH (ref 0.0–40.0)

## 2014-09-08 LAB — TSH: TSH: 0.92 u[IU]/mL (ref 0.35–4.50)

## 2014-09-12 ENCOUNTER — Ambulatory Visit (INDEPENDENT_AMBULATORY_CARE_PROVIDER_SITE_OTHER): Payer: 59 | Admitting: Family Medicine

## 2014-09-12 ENCOUNTER — Other Ambulatory Visit (HOSPITAL_COMMUNITY)
Admission: RE | Admit: 2014-09-12 | Discharge: 2014-09-12 | Disposition: A | Discharge status: Home / Self Care | Payer: 59 | Source: Ambulatory Visit | Attending: Family Medicine | Admitting: Family Medicine

## 2014-09-12 ENCOUNTER — Encounter: Payer: Self-pay | Admitting: Family Medicine

## 2014-09-12 VITALS — BP 110/70 | Temp 97.8°F | Ht 69.0 in | Wt 300.0 lb

## 2014-09-12 DIAGNOSIS — Z01419 Encounter for gynecological examination (general) (routine) without abnormal findings: Secondary | ICD-10-CM | POA: Insufficient documentation

## 2014-09-12 DIAGNOSIS — Z1151 Encounter for screening for human papillomavirus (HPV): Secondary | ICD-10-CM | POA: Diagnosis present

## 2014-09-12 DIAGNOSIS — E669 Obesity, unspecified: Secondary | ICD-10-CM

## 2014-09-12 DIAGNOSIS — Z0000 Encounter for general adult medical examination without abnormal findings: Secondary | ICD-10-CM

## 2014-09-12 MED ORDER — ORTHO-NOVUM 7/7/7 (28) 0.5/0.75/1-35 MG-MCG PO TABS: 1.0000 | ORAL_TABLET | Freq: Every day | ORAL | Status: DC

## 2014-09-12 MED ORDER — CITALOPRAM HYDROBROMIDE 20 MG PO TABS: 20.0000 mg | ORAL_TABLET | Freq: Every day | ORAL | Status: DC

## 2014-09-12 NOTE — Patient Instructions (Signed)
Continue current medication and exercise program  I would encourage you to join the weight watchers program  Return in one year sooner if any problems  During this transition. If you have any issues questions or problems call and leave a voicemail with Erika Mullins

## 2014-09-12 NOTE — Progress Notes (Signed)
   Subjective:    Patient ID: Erika Mullins, female    DOB: February 16, 1976, 38 y.o.   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 She states she goes to the gym with her mother 3 times a week and works with a Physiological scientist. However she's not been following a diet. I encouraged her to join Weight Watchers   Review of Systems  Constitutional: Negative.   HENT: Negative.   Eyes: Negative.   Respiratory: Negative.   Cardiovascular: Negative.   Gastrointestinal: Negative.   Endocrine: Negative.   Genitourinary: Negative.   Musculoskeletal: Negative.   Skin: Negative.   Allergic/Immunologic: Negative.   Neurological: Negative.   Hematological: Negative.   Psychiatric/Behavioral: Negative.        Objective:   Physical Exam  Constitutional: She appears well-developed and well-nourished.  HENT:  Head: Normocephalic and atraumatic.  Right Ear: External ear normal.  Left Ear: External ear normal.  Nose: Nose normal.  Mouth/Throat: Oropharynx is clear and moist.  Eyes: EOM are normal. Pupils are equal, round, and reactive to light.  Neck: Normal range of motion. Neck supple. No JVD present. No tracheal deviation present. No thyromegaly present.  Cardiovascular: Normal rate, regular rhythm, normal heart sounds and intact distal pulses.  Exam reveals no gallop and no friction rub.   No murmur heard. Pulmonary/Chest: Effort normal and breath sounds normal. No stridor. No respiratory distress. She has no wheezes. She has no rales. She exhibits no tenderness.  Abdominal: Soft. Bowel sounds are normal. She exhibits no distension and no mass. There is no tenderness.  There is no rebound and no guarding.  Genitourinary: Vagina normal and uterus normal. No vaginal discharge found.  Bilateral breast exam normal  Musculoskeletal: Normal range of motion.  Lymphadenopathy:    She has no cervical adenopathy.  Neurological: She is alert. She has normal reflexes. No cranial nerve deficit. She exhibits normal muscle tone. Coordination normal.  Skin: Skin is warm and dry. No rash noted. No erythema. No pallor.  Psychiatric: She has a normal mood and affect. Her behavior is normal. Judgment and thought content normal.  Nursing note and vitals reviewed.         Assessment & Plan:  Healthy female  Dysfunction uterine bleeding,,,,,,, refill birth control pills  Overweight,,,,,,,,,,,,, again discussed diet exercise and Weight Watchers program

## 2014-09-12 NOTE — Progress Notes (Signed)
Pre visit review using our clinic review tool, if applicable. No additional management support is needed unless otherwise documented below in the visit note. 

## 2014-09-18 LAB — CYTOLOGY - PAP

## 2014-10-22 ENCOUNTER — Other Ambulatory Visit: Payer: Self-pay | Admitting: Family Medicine

## 2015-02-12 ENCOUNTER — Telehealth: Payer: Self-pay | Admitting: Family Medicine

## 2015-02-12 MED ORDER — CITALOPRAM HYDROBROMIDE 20 MG PO TABS: 20.0000 mg | ORAL_TABLET | Freq: Every day | ORAL | Status: DC

## 2015-02-12 NOTE — Telephone Encounter (Signed)
Rx sent 

## 2015-02-12 NOTE — Telephone Encounter (Signed)
Pt needs new rx celexa 30 mg #30 sent to [facility] [address]

## 2015-04-24 NOTE — Telephone Encounter (Signed)
Needs appt, not seen in some time and this is new dose. Thanks.

## 2015-04-24 NOTE — Telephone Encounter (Signed)
Pt needs celexa 30 mg not 20 mg

## 2015-04-25 NOTE — Telephone Encounter (Signed)
Left a message at the pts cell number to return my call. 

## 2015-05-07 ENCOUNTER — Telehealth: Payer: Self-pay | Admitting: *Deleted

## 2015-05-07 NOTE — Telephone Encounter (Signed)
Erika Mullins-patient called with questions about her dosing for Celexa? What were Dr Honor Junes recommendations?

## 2015-05-08 MED ORDER — CITALOPRAM HYDROBROMIDE 40 MG PO TABS: 40.0000 mg | ORAL_TABLET | Freq: Every day | ORAL | Status: DC

## 2015-05-08 NOTE — Telephone Encounter (Signed)
Okay per Dr Sherren Mocha and patient is aware.

## 2015-06-29 ENCOUNTER — Other Ambulatory Visit: Payer: Self-pay | Admitting: Family Medicine

## 2015-10-16 ENCOUNTER — Other Ambulatory Visit: Payer: Self-pay | Admitting: Family Medicine

## 2015-10-16 NOTE — Telephone Encounter (Signed)
Pt called saying her dogs ate her birth control pills and she needs a refill sent to her pharmacy. [facility] [address] will send a request for [facility]. If you have questions, please give the pt a call.   Pt's ph# [phone] Thank you.

## 2016-03-04 ENCOUNTER — Other Ambulatory Visit: Payer: Self-pay

## 2016-03-11 ENCOUNTER — Encounter: Payer: Self-pay | Admitting: Family Medicine

## 2016-03-29 ENCOUNTER — Other Ambulatory Visit: Payer: Self-pay | Admitting: Family Medicine

## 2016-06-01 ENCOUNTER — Other Ambulatory Visit: Payer: Self-pay | Admitting: Family Medicine

## 2016-06-02 NOTE — Telephone Encounter (Signed)
Rx refill sent to pharmacy. 

## 2016-09-02 ENCOUNTER — Other Ambulatory Visit (INDEPENDENT_AMBULATORY_CARE_PROVIDER_SITE_OTHER): Payer: 59

## 2016-09-02 DIAGNOSIS — Z0000 Encounter for general adult medical examination without abnormal findings: Secondary | ICD-10-CM | POA: Diagnosis not present

## 2016-09-02 LAB — BASIC METABOLIC PANEL
BUN: 6 mg/dL (ref 6–23)
CHLORIDE: 101 meq/L (ref 96–112)
CO2: 27 mEq/L (ref 19–32)
Calcium: 9.6 mg/dL (ref 8.4–10.5)
Creatinine, Ser: 0.71 mg/dL (ref 0.40–1.20)
GFR: 97.5 mL/min (ref >60.00)
Glucose, Bld: 100 mg/dL — ABNORMAL HIGH (ref 70–99)
POTASSIUM: 4.7 meq/L (ref 3.5–5.1)
Sodium: 137 mEq/L (ref 135–145)

## 2016-09-02 LAB — CBC WITH DIFFERENTIAL/PLATELET
BASOS PCT: 0.5 % (ref 0.0–3.0)
Basophils Absolute: 0.1 10*3/uL (ref 0.0–0.1)
EOS ABS: 0.2 10*3/uL (ref 0.0–0.7)
EOS PCT: 1.7 % (ref 0.0–5.0)
HEMATOCRIT: 43.2 % (ref 36.0–46.0)
HEMOGLOBIN: 14.6 g/dL (ref 12.0–15.0)
LYMPHS PCT: 28.8 % (ref 12.0–46.0)
Lymphs Abs: 3.8 10*3/uL (ref 0.7–4.0)
MCHC: 33.9 g/dL (ref 30.0–36.0)
MCV: 86.4 fl (ref 78.0–100.0)
Monocytes Absolute: 0.9 10*3/uL (ref 0.1–1.0)
Monocytes Relative: 6.7 % (ref 3.0–12.0)
Neutro Abs: 8.3 10*3/uL — ABNORMAL HIGH (ref 1.4–7.7)
Neutrophils Relative %: 62.3 % (ref 43.0–77.0)
Platelets: 505 10*3/uL — ABNORMAL HIGH (ref 150.0–400.0)
RBC: 4.99 Mil/uL (ref 3.87–5.11)
RDW: 12.6 % (ref 11.5–15.5)
WBC: 13.3 10*3/uL — AB (ref 4.0–10.5)

## 2016-09-02 LAB — LIPID PANEL
CHOL/HDL RATIO: 7
Cholesterol: 249 mg/dL — ABNORMAL HIGH (ref 0–200)
HDL: 36.3 mg/dL — ABNORMAL LOW (ref >39.00)
NONHDL: 212.7
TRIGLYCERIDES: 257 mg/dL — AB (ref 0.0–149.0)
VLDL: 51.4 mg/dL — ABNORMAL HIGH (ref 0.0–40.0)

## 2016-09-02 LAB — HEPATIC FUNCTION PANEL
ALT: 9 U/L (ref 0–35)
AST: 11 U/L (ref 0–37)
Albumin: 4 g/dL (ref 3.5–5.2)
Alkaline Phosphatase: 83 U/L (ref 39–117)
BILIRUBIN DIRECT: 0.1 mg/dL (ref 0.0–0.3)
BILIRUBIN TOTAL: 0.4 mg/dL (ref 0.2–1.2)
TOTAL PROTEIN: 6.4 g/dL (ref 6.0–8.3)

## 2016-09-02 LAB — POC URINALSYSI DIPSTICK (AUTOMATED)
Bilirubin, UA: NEGATIVE
GLUCOSE UA: NEGATIVE
Ketones, UA: NEGATIVE
Leukocytes, UA: NEGATIVE
NITRITE UA: NEGATIVE
PH UA: 5.5
PROTEIN UA: NEGATIVE
RBC UA: NEGATIVE
UROBILINOGEN UA: 0.2

## 2016-09-02 LAB — LDL CHOLESTEROL, DIRECT: LDL DIRECT: 175 mg/dL

## 2016-09-02 LAB — TSH: TSH: 1.36 u[IU]/mL (ref 0.35–4.50)

## 2016-09-09 ENCOUNTER — Other Ambulatory Visit (HOSPITAL_COMMUNITY)
Admission: RE | Admit: 2016-09-09 | Discharge: 2016-09-09 | Disposition: A | Discharge status: Home / Self Care | Payer: 59 | Source: Ambulatory Visit | Attending: Family Medicine | Admitting: Family Medicine

## 2016-09-09 ENCOUNTER — Encounter: Payer: Self-pay | Admitting: Family Medicine

## 2016-09-09 ENCOUNTER — Ambulatory Visit (INDEPENDENT_AMBULATORY_CARE_PROVIDER_SITE_OTHER): Payer: 59 | Admitting: Family Medicine

## 2016-09-09 VITALS — BP 112/68 | HR 98 | Temp 98.1°F | Ht 68.25 in | Wt 287.0 lb

## 2016-09-09 DIAGNOSIS — J301 Allergic rhinitis due to pollen: Secondary | ICD-10-CM

## 2016-09-09 DIAGNOSIS — Z0000 Encounter for general adult medical examination without abnormal findings: Secondary | ICD-10-CM | POA: Diagnosis not present

## 2016-09-09 DIAGNOSIS — Z01419 Encounter for gynecological examination (general) (routine) without abnormal findings: Secondary | ICD-10-CM | POA: Diagnosis present

## 2016-09-09 DIAGNOSIS — E6609 Other obesity due to excess calories: Secondary | ICD-10-CM

## 2016-09-09 DIAGNOSIS — F419 Anxiety disorder, unspecified: Secondary | ICD-10-CM | POA: Diagnosis not present

## 2016-09-09 DIAGNOSIS — Z6841 Body Mass Index (BMI) 40.0 and over, adult: Secondary | ICD-10-CM

## 2016-09-09 DIAGNOSIS — IMO0001 Reserved for inherently not codable concepts without codable children: Secondary | ICD-10-CM

## 2016-09-09 DIAGNOSIS — Z124 Encounter for screening for malignant neoplasm of cervix: Secondary | ICD-10-CM

## 2016-09-09 MED ORDER — CITALOPRAM HYDROBROMIDE 40 MG PO TABS: 40.0000 mg | ORAL_TABLET | Freq: Every day | ORAL | 1 refills | Status: DC

## 2016-09-09 MED ORDER — CETIRIZINE HCL 10 MG PO TABS: 10.0000 mg | ORAL_TABLET | Freq: Every day | ORAL | 4 refills | Status: AC

## 2016-09-09 MED ORDER — FLUTICASONE PROPIONATE 50 MCG/ACT NA SUSP: 2.0000 | Freq: Every day | NASAL | 4 refills | Status: DC

## 2016-09-09 MED ORDER — MONTELUKAST SODIUM 10 MG PO TABS: 10.0000 mg | ORAL_TABLET | Freq: Every day | ORAL | 4 refills | Status: DC

## 2016-09-09 MED ORDER — BUPROPION HCL 75 MG PO TABS: ORAL_TABLET | ORAL | 4 refills | Status: DC

## 2016-09-09 MED ORDER — NORETHIN-ETH ESTRAD TRIPHASIC 0.5/0.75/1-35 MG-MCG PO TABS: 1.0000 | ORAL_TABLET | Freq: Every day | ORAL | 4 refills | Status: DC

## 2016-09-09 NOTE — Addendum Note (Signed)
Addended by: Wynn Banker H on: 09/09/2016 12:17 PM   Modules accepted: Orders

## 2016-09-09 NOTE — Patient Instructions (Signed)
Zyrtec 10 mg plain.......Marland Kitchen 1 at bedtime  Steroid nasal spray......Marland Kitchen 1 shot up each nostril twice daily  Singulair 10 mg......Marland Kitchen 1 at bedtime  Continue BCPs  Stop the Celexa.......... Wellbutrin 75 mg............Marland Kitchen 1 daily in the morning  Call if there is any issue with the change in your medication.

## 2016-09-09 NOTE — Progress Notes (Signed)
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 She recently joined Weight Watchers and is dropped from 300 pounds down to 287 pounds. Blood pressure normal 110/68  70 difficulty pain in her left knee. No history of trauma no swelling or locking.  Set a cough for a week and a cold  She noticed a.left eye last night. No showers. Normal visual fields  She gets routine eye care, dental care, starting mammography at 42 colonoscopy at 79 no family history of colon cancer polyps  He takes Celexa 40 mg daily for chronic low-grade depression  She takes BCPs for dysfunctional uterine bleeding. LMP 2 weeks ago normal..  Physical evaluation vital signs stable she's afebrile examination of the HEENT were negative neck was supple no adenopathy thyroid normal cardiopulmonary exam normal breast exam normal abdominal exam normal pelvic examination external genitalia within normal limits vaginal vault was normal cervix is visualized Pap smear was done bimanual exam was negative extremities normal skin normal peripheral pulses normal.  Left knee ligaments and cartilage is intact distal tenderness over the patellar tendon.  Impression #1 obesity............ continue weight watchers  #2 tendinitis left knee..... Motrin 400 twice a day with food elevation and ice  #3 allergic rhinitis......Marland Kitchen Zyrtec and/or Allegra OTC plain  #4 history of depression........ trial of Wellbutrin

## 2016-09-10 ENCOUNTER — Other Ambulatory Visit: Payer: Self-pay | Admitting: Family Medicine

## 2016-09-16 LAB — CYTOLOGY - PAP
Adequacy: ABSENT
Diagnosis: NEGATIVE

## 2016-10-03 ENCOUNTER — Telehealth: Payer: Self-pay | Admitting: Family Medicine

## 2016-10-03 NOTE — Telephone Encounter (Signed)
° ° °  Pt call to say that she has been on the below med for about a month and said Dr Sherren Mocha told her after being on a month if she thinks it need to be increased to let him know Pt called today to ask for the increased.

## 2016-10-03 NOTE — Telephone Encounter (Signed)
Called and spoke with pt informing pt that Dr. Sherren Mocha is out of the office and I will run her concern by him upon his return and give her a call. Pt verbalized understanding.

## 2016-10-06 ENCOUNTER — Other Ambulatory Visit: Payer: Self-pay | Admitting: Emergency Medicine

## 2016-10-06 MED ORDER — BUPROPION HCL 100 MG PO TABS: ORAL_TABLET | ORAL | 4 refills | Status: DC

## 2016-10-21 DIAGNOSIS — L858 Other specified epidermal thickening: Secondary | ICD-10-CM | POA: Diagnosis not present

## 2016-10-21 DIAGNOSIS — D2262 Melanocytic nevi of left upper limb, including shoulder: Secondary | ICD-10-CM | POA: Diagnosis not present

## 2016-10-21 DIAGNOSIS — L918 Other hypertrophic disorders of the skin: Secondary | ICD-10-CM | POA: Diagnosis not present

## 2016-10-28 ENCOUNTER — Ambulatory Visit
Admission: RE | Admit: 2016-10-28 | Discharge: 2016-10-28 | Disposition: A | Discharge status: Home / Self Care | Payer: Worker's Compensation | Source: Ambulatory Visit | Attending: Nurse Practitioner | Admitting: Nurse Practitioner

## 2016-10-28 ENCOUNTER — Other Ambulatory Visit: Payer: Self-pay | Admitting: Nurse Practitioner

## 2016-10-28 DIAGNOSIS — T1490XA Injury, unspecified, initial encounter: Secondary | ICD-10-CM

## 2017-02-14 ENCOUNTER — Other Ambulatory Visit: Payer: Self-pay | Admitting: Family Medicine

## 2017-02-16 NOTE — Telephone Encounter (Signed)
Denied.  Pt no longer taking this medication.  Message sent to the pharmacy to d/c.

## 2017-03-10 ENCOUNTER — Telehealth: Payer: Self-pay | Admitting: Family Medicine

## 2017-03-10 NOTE — Telephone Encounter (Signed)
° ° ° °  Pt request refill of the following:  citalopram (CELEXA) 40 MG tablet   Phamacy:

## 2017-03-11 MED ORDER — CITALOPRAM HYDROBROMIDE 40 MG PO TABS: 40.0000 mg | ORAL_TABLET | Freq: Every day | ORAL | 1 refills | Status: DC

## 2017-03-11 NOTE — Addendum Note (Signed)
Addended by: Miles Costain T on: 03/11/2017 10:12 AM   Modules accepted: Orders

## 2017-03-11 NOTE — Telephone Encounter (Signed)
Sent to the pharmacy by e-scribe. 

## 2017-07-31 DIAGNOSIS — Z23 Encounter for immunization: Secondary | ICD-10-CM | POA: Diagnosis not present

## 2017-08-14 DIAGNOSIS — B354 Tinea corporis: Secondary | ICD-10-CM | POA: Diagnosis not present

## 2017-09-03 DIAGNOSIS — D485 Neoplasm of uncertain behavior of skin: Secondary | ICD-10-CM | POA: Diagnosis not present

## 2017-09-03 DIAGNOSIS — D2262 Melanocytic nevi of left upper limb, including shoulder: Secondary | ICD-10-CM | POA: Diagnosis not present

## 2017-09-03 DIAGNOSIS — L918 Other hypertrophic disorders of the skin: Secondary | ICD-10-CM | POA: Diagnosis not present

## 2017-09-03 DIAGNOSIS — D225 Melanocytic nevi of trunk: Secondary | ICD-10-CM | POA: Diagnosis not present

## 2017-09-18 ENCOUNTER — Other Ambulatory Visit: Payer: Self-pay

## 2017-09-18 MED ORDER — CITALOPRAM HYDROBROMIDE 40 MG PO TABS: 40.0000 mg | ORAL_TABLET | Freq: Every day | ORAL | 1 refills | Status: DC

## 2017-11-02 ENCOUNTER — Other Ambulatory Visit: Payer: Self-pay | Admitting: Family Medicine

## 2017-12-29 IMAGING — CR DG FOOT COMPLETE 3+V*L*
3 series · 3 of 3 positions shown · non-contrast
Comparison: Left foot films of 11/12/2005

CLINICAL DATA: Dropped heavy object on foot yesterday with swelling
and bruising

EXAM:
LEFT FOOT - COMPLETE 3+ VIEW

[x foot ap left]
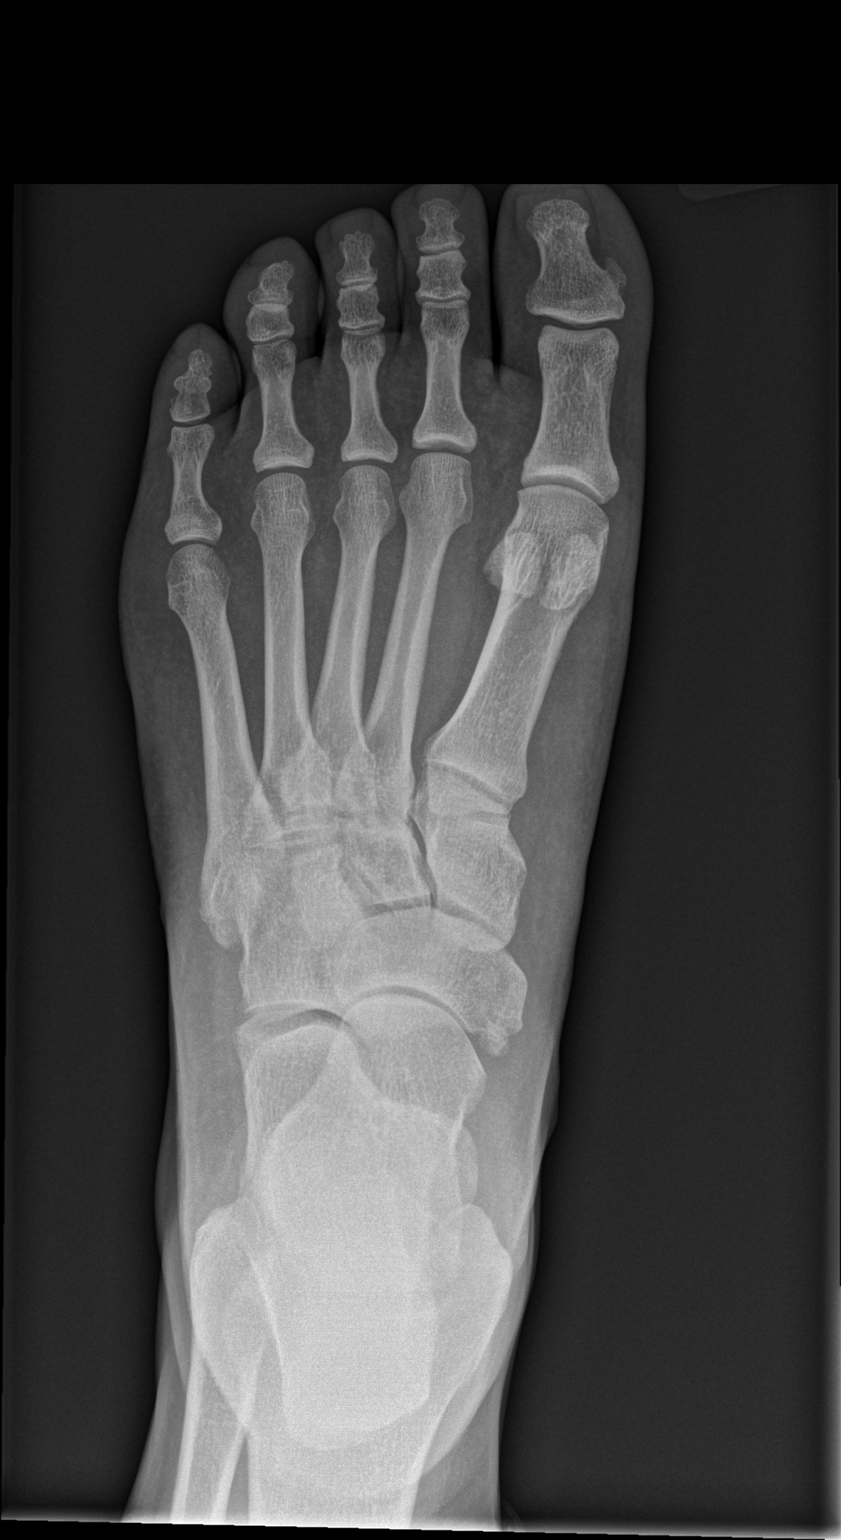

[x foot obl left]
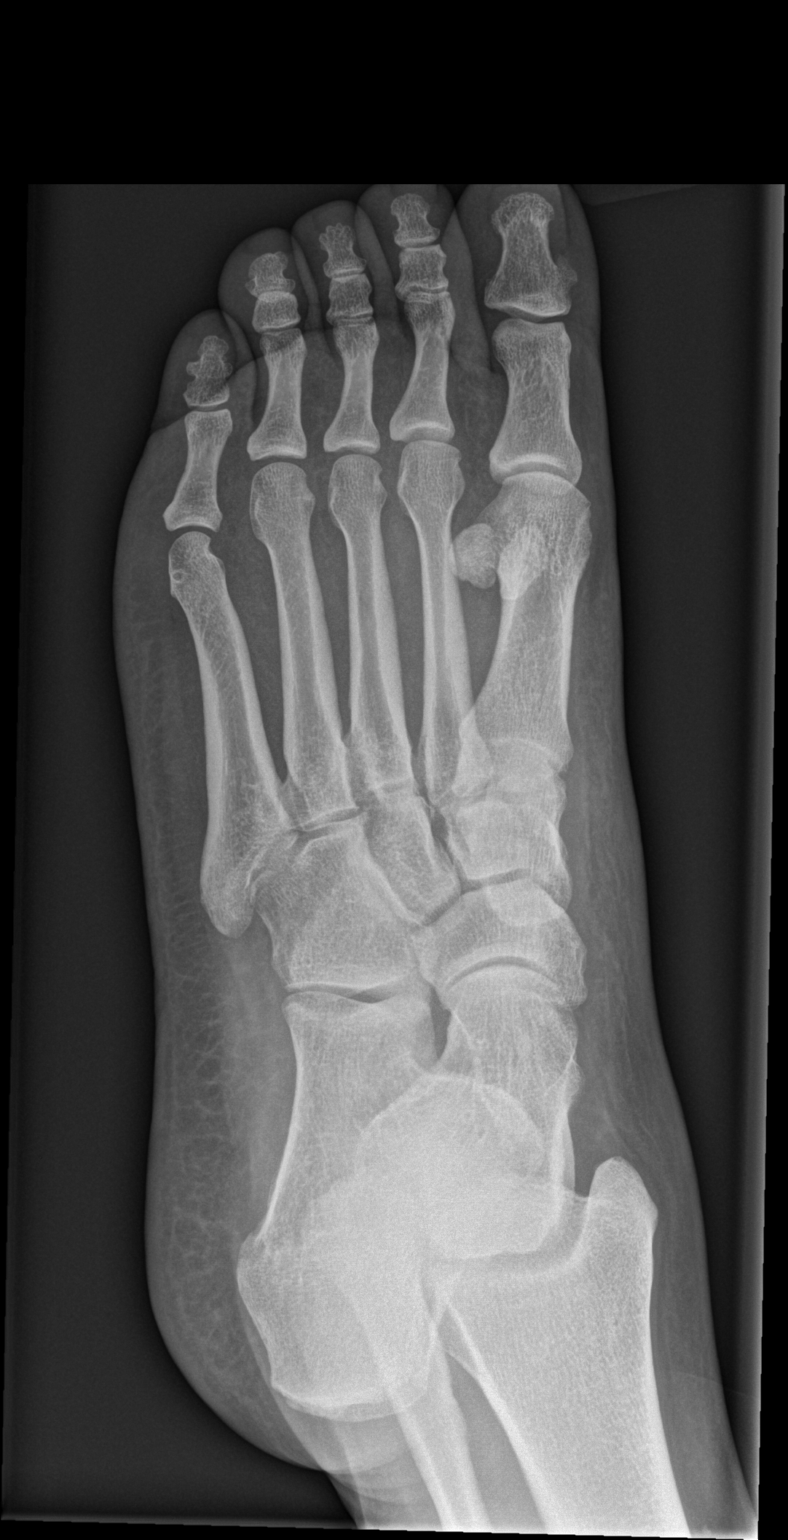

[x foot lat left]
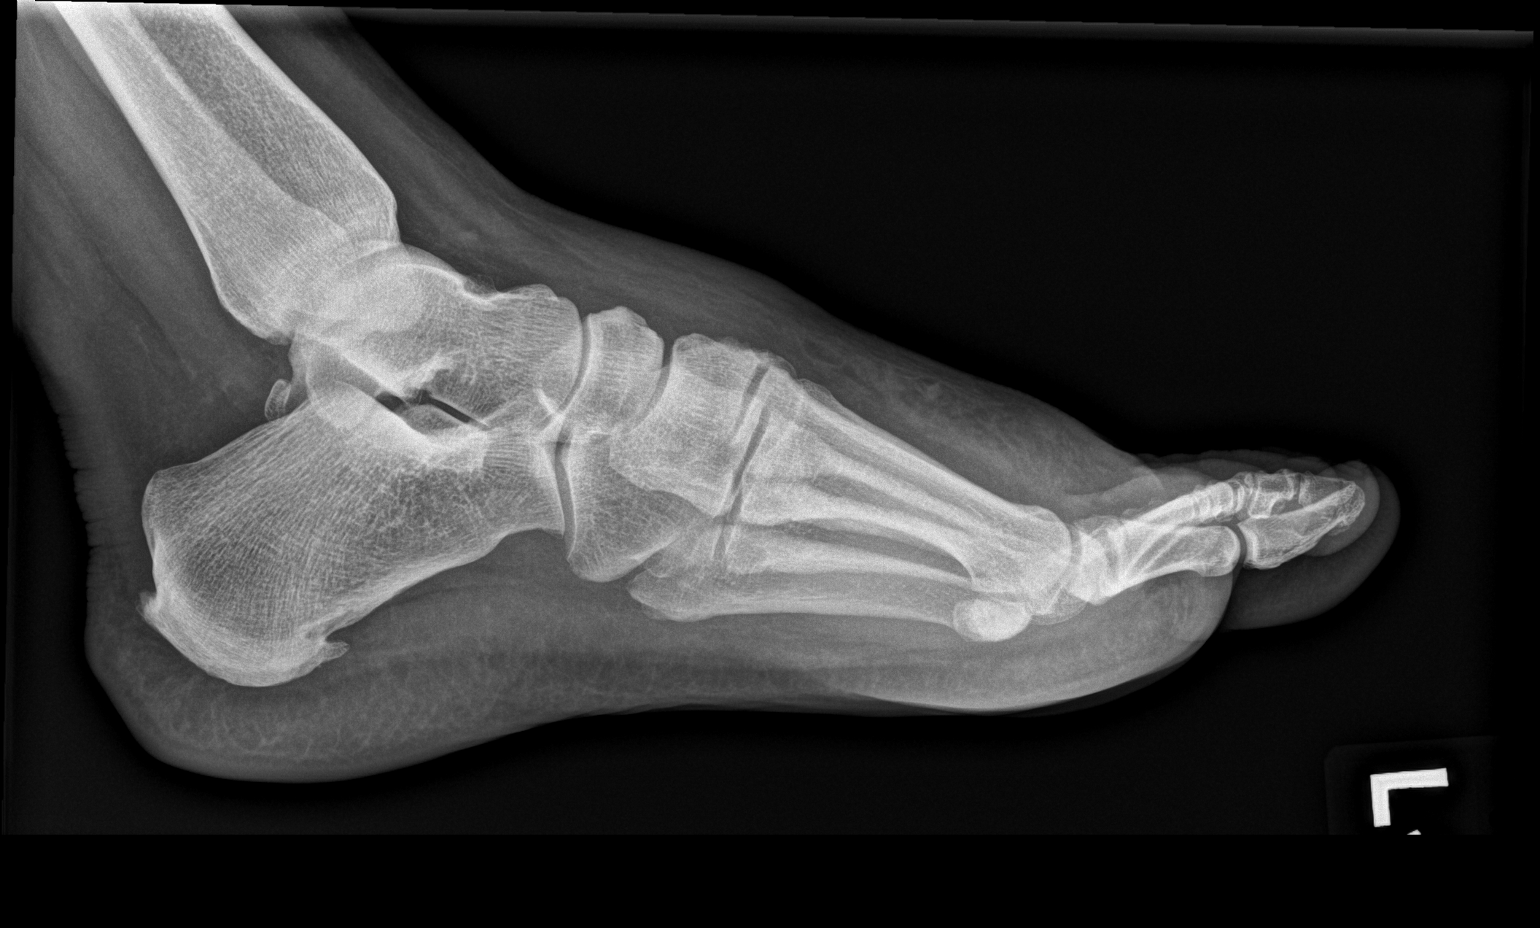

[3 of 3 positions shown; findings below may reference images not displayed]

FINDINGS: There is soft tissue swelling over the dorsum of the foot. However
no acute fracture is seen. Alignment is normal. Mild degenerative
change is noted in the midfoot. Degenerative calcaneal spurs are
present.
IMPRESSION: No acute fracture. Degenerative change in the midfoot with calcaneal
degenerative spurs as well.

## 2018-03-29 ENCOUNTER — Ambulatory Visit: Payer: 59 | Admitting: Family Medicine

## 2018-03-29 ENCOUNTER — Encounter: Payer: Self-pay | Admitting: Family Medicine

## 2018-03-29 VITALS — BP 110/80 | HR 84 | Temp 98.2°F | Ht 68.25 in | Wt 323.5 lb

## 2018-03-29 DIAGNOSIS — N63 Unspecified lump in unspecified breast: Secondary | ICD-10-CM | POA: Diagnosis not present

## 2018-03-29 NOTE — Patient Instructions (Signed)
-  We [facility] a [facility] for you as [facility]. It usually takes about 1 week to process and schedule this referral. If you have not heard from someone regarding this appointment in 1 weeks please contact our office.

## 2018-03-29 NOTE — Progress Notes (Signed)
HPI:  Using dictation device. Unfortunately this device frequently misinterprets words/phrases.  Acute visit for breast lump: -reports has noticed a small, hard, non tender pea sized lump in upper outer quadrant of L breast for about 1-2 months -no skin changes, nipple discharge, pain, malaise, FH   ROS: See pertinent positives and negatives per HPI.  Past Medical History:  Diagnosis Date  . Adult acne   . Dysfunctional uterine bleeding   . Obesity     Past Surgical History:  Procedure Laterality Date  . CARPAL TUNNEL RELEASE  09/2010   Right  . TONSILLECTOMY AND ADENOIDECTOMY    . TYMPANOSTOMY TUBE PLACEMENT     bilateral    Family History  Problem Relation Age of Onset  . Hyperlipidemia Unknown   . Hypertension Unknown   . Obesity Unknown   . Hyperlipidemia Father   . Heart disease Father   . Obesity Father   . Stroke Maternal Grandmother   . Cancer Maternal Grandfather        Pancreatic  . Hyperlipidemia Maternal Grandfather   . Heart attack Maternal Grandfather     SOCIAL HX: see hpi   Current Outpatient Medications:  .  calcium carbonate (OS-CAL) 600 MG TABS, Take 600 mg by mouth 2 (two) times daily with a meal., Disp: , Rfl:  .  cetirizine (ZYRTEC) 10 MG tablet, Take 1 tablet (10 mg total) by mouth daily., Disp: 100 tablet, Rfl: 4 .  citalopram (CELEXA) 40 MG tablet, Take 1 tablet (40 mg total) by mouth daily., Disp: 90 tablet, Rfl: 1 .  fluticasone (FLONASE) 50 MCG/ACT nasal spray, Place 2 sprays into both nostrils daily., Disp: 48 g, Rfl: 4 .  ibuprofen (ADVIL,MOTRIN) 200 MG tablet, Take 600-800 mg by mouth every 6 (six) hours as needed for pain., Disp: , Rfl:  .  loperamide (IMODIUM) 2 MG capsule, Take 2 mg by mouth 2 (two) times daily as needed for diarrhea or loose stools., Disp: , Rfl:  .  montelukast (SINGULAIR) 10 MG tablet, Take 1 tablet (10 mg total) by mouth at bedtime., Disp: 100 tablet, Rfl: 4 .  Multiple Vitamin (MULTIVITAMIN WITH MINERALS)  TABS, Take 1 tablet by mouth every morning., Disp: , Rfl:  .  NORTREL 7/7/7 0.5/0.75/1-35 MG-MCG tablet, TAKE 1 TABLET BY MOUTH DAILY, Disp: 84 tablet, Rfl: 4  EXAM:  Vitals:   03/29/18 0835  BP: 110/80  Pulse: 84  Temp: 98.2 F (36.8 C)    Body mass index is 48.83 kg/m.  GENERAL: vitals reviewed and listed above, alert, oriented, appears well hydrated and in no acute distress  HEENT: atraumatic, conjunttiva clear, no obvious abnormalities on inspection of external nose and ears  NECK: no obvious masses on inspection  BREAST: normal inspection and palpation of the L breast - I do not appreciate any abnormality on exam today, but patient is sure she felt the lump recently - today  MS: moves all extremities without noticeable abnormality  PSYCH: pleasant and cooperative, no obvious depression or anxiety  ASSESSMENT AND PLAN:  Discussed the following assessment and plan:  Breast lump - Plan: US BREAST LTD UNI LEFT INC AXILLA, MM Digital Diagnostic Unilat L  -I am not able to feel a defect on exam today - discussed potential etiologies, will have pt see [facility] center for evaluation, assistant to order referral -Patient advised to return or notify a doctor immediately if symptoms worsen or persist or new concerns arise.  Patient Instructions  -We placed a referral for  you as [facility] t [facility]. It usually takes about 1 week to process and schedule this referral. If you have not heard from someone regarding this appointment in 1 weeks please contact our office.    Lucretia Kern, DO

## 2018-04-07 ENCOUNTER — Ambulatory Visit
Admission: RE | Admit: 2018-04-07 | Discharge: 2018-04-07 | Disposition: A | Discharge status: Home / Self Care | Payer: 59 | Source: Ambulatory Visit | Attending: Family Medicine | Admitting: Family Medicine

## 2018-04-07 ENCOUNTER — Ambulatory Visit
Admission: RE | Admit: 2018-04-07 | Discharge: 2018-04-07 | Disposition: A | Discharge status: Home / Self Care | Payer: Self-pay | Source: Ambulatory Visit | Attending: Family Medicine | Admitting: Family Medicine

## 2018-04-07 DIAGNOSIS — N63 Unspecified lump in unspecified breast: Secondary | ICD-10-CM

## 2018-04-07 DIAGNOSIS — R928 Other abnormal and inconclusive findings on diagnostic imaging of breast: Secondary | ICD-10-CM | POA: Diagnosis not present

## 2018-04-07 DIAGNOSIS — N6489 Other specified disorders of breast: Secondary | ICD-10-CM | POA: Diagnosis not present

## 2018-05-22 ENCOUNTER — Other Ambulatory Visit: Payer: Self-pay | Admitting: Family Medicine

## 2018-05-31 DIAGNOSIS — J302 Other seasonal allergic rhinitis: Secondary | ICD-10-CM | POA: Diagnosis not present

## 2018-05-31 DIAGNOSIS — S0030XA Unspecified superficial injury of nose, initial encounter: Secondary | ICD-10-CM | POA: Diagnosis not present

## 2018-05-31 DIAGNOSIS — S0992XA Unspecified injury of nose, initial encounter: Secondary | ICD-10-CM | POA: Insufficient documentation

## 2018-09-14 ENCOUNTER — Encounter: Payer: Self-pay | Admitting: Family Medicine

## 2018-10-12 ENCOUNTER — Other Ambulatory Visit: Payer: Self-pay | Admitting: *Deleted

## 2018-10-12 MED ORDER — CITALOPRAM HYDROBROMIDE 40 MG PO TABS: 40.0000 mg | ORAL_TABLET | Freq: Every day | ORAL | 0 refills | Status: DC

## 2018-10-12 NOTE — Telephone Encounter (Signed)
Rx done as pt has new pt appt with Dr Volanda Napoleon on 10/27/2018.

## 2018-10-19 ENCOUNTER — Other Ambulatory Visit: Payer: Self-pay | Admitting: *Deleted

## 2018-10-19 MED ORDER — CITALOPRAM HYDROBROMIDE 40 MG PO TABS: 40.0000 mg | ORAL_TABLET | Freq: Every day | ORAL | 0 refills | Status: DC

## 2018-10-27 ENCOUNTER — Encounter: Payer: Self-pay | Admitting: Family Medicine

## 2018-11-04 ENCOUNTER — Ambulatory Visit: Payer: 59 | Admitting: Family Medicine

## 2018-11-04 ENCOUNTER — Encounter: Payer: Self-pay | Admitting: Family Medicine

## 2018-11-04 VITALS — BP 102/80 | HR 94 | Temp 98.0°F | Ht 68.0 in

## 2018-11-04 DIAGNOSIS — Z9889 Other specified postprocedural states: Secondary | ICD-10-CM

## 2018-11-04 DIAGNOSIS — E66813 Obesity, class 3: Secondary | ICD-10-CM

## 2018-11-04 DIAGNOSIS — Z3041 Encounter for surveillance of contraceptive pills: Secondary | ICD-10-CM | POA: Diagnosis not present

## 2018-11-04 DIAGNOSIS — Z6841 Body Mass Index (BMI) 40.0 and over, adult: Secondary | ICD-10-CM

## 2018-11-04 DIAGNOSIS — F419 Anxiety disorder, unspecified: Secondary | ICD-10-CM | POA: Diagnosis not present

## 2018-11-04 MED ORDER — CITALOPRAM HYDROBROMIDE 40 MG PO TABS: 40.0000 mg | ORAL_TABLET | Freq: Every day | ORAL | 11 refills | Status: DC

## 2018-11-04 MED ORDER — NORETHIN-ETH ESTRAD TRIPHASIC 0.5/0.75/1-35 MG-MCG PO TABS: 1.0000 | ORAL_TABLET | Freq: Every day | ORAL | 4 refills | Status: DC

## 2018-11-04 NOTE — Progress Notes (Signed)
Subjective:    Patient ID: Erika Mullins, female    DOB: 1976/01/14, 43 y.o.   MRN: [id number]  No chief complaint on file.   HPI Patient was seen today for f/u on chronic conditions and TOC, previously seen by Dr. Sherren Mocha.    Anxiety: -controlled on Citalopram 40 mg. -pt tried to stop med in the past but notes feeling better on it. -endorses good mood, energy, and sleep after switching job positions.  Now works for [employer], was doing Best boy  [employer]: -pt endorses needing to lose weight -eating out frequently -plans to cook more and go to [employer]' class -has a Building services engineer, but not going. -drinks mostly water.  Has a cup of coffee.   H/o Carpal Tunnel: -s/p R release -noted improvement in symptoms, but states the L wrist is having more problems -L wrist will fall asleep at night causing pt to wake up to shake hand -using Ergonomic keyboard at work  Request refills on OCPs.  Past Medical History:  Diagnosis Date  . Adult acne   . Dysfunctional uterine bleeding   . Obesity     Allergies  Allergen Reactions  . Doxycycline Hives  . Nickel Hives    ROS General: Denies fever, chills, night sweats, changes in weight, changes in appetite HEENT: Denies headaches, ear pain, changes in vision, rhinorrhea, sore throat CV: Denies CP, palpitations, SOB, orthopnea Pulm: Denies SOB, cough, wheezing GI: Denies abdominal pain, nausea, vomiting, diarrhea, constipation GU: Denies dysuria, hematuria, frequency, vaginal discharge Msk: Denies muscle cramps, joint pains Neuro: Denies weakness, numbness, tingling  +L wrist numbness Skin: Denies rashes, bruising Psych: Denies depression, hallucinations +anxiety     Objective:    Blood pressure 102/80, pulse 94, temperature 98 F (36.7 C), temperature source Oral, height 5\' 8"  (1.727 m), SpO2 97 %.  Gen. Pleasant, well-nourished, in no distress, normal affect   HEENT: [place]/AT, face  symmetric, no scleral icterus, PERRLA, nares patent without drainage Lungs: no accessory muscle use, CTAB, no wheezes or rales Cardiovascular: RRR, no m/r/g, no peripheral edema Neuro:  A&Ox3, CN II-XII intact, normal gait Skin:  Warm, no lesions/ rash   Wt Readings from Last 3 Encounters:  03/29/18 (!) 323 lb 8 oz (146.7 kg)  09/09/16 287 lb (130.2 kg)  09/12/14 300 lb (136.1 kg)    Lab Results  Component Value Date   WBC 13.3 (H) 09/02/2016   HGB 14.6 09/02/2016   HCT 43.2 09/02/2016   PLT 505.0 (H) 09/02/2016   GLUCOSE 100 (H) 09/02/2016   CHOL 249 (H) 09/02/2016   TRIG 257.0 (H) 09/02/2016   HDL 36.30 (L) 09/02/2016   LDLDIRECT 175.0 09/02/2016   ALT 9 09/02/2016   AST 11 09/02/2016   NA 137 09/02/2016   K 4.7 09/02/2016   CL 101 09/02/2016   CREATININE 0.71 09/02/2016   BUN 6 09/02/2016   CO2 27 09/02/2016   TSH 1.36 09/02/2016    Assessment/Plan:  Anxiety   -controlled -obtain PHQ 9 and GAD 7 at next OFV. -continue current meds - Plan: citalopram (CELEXA) 40 MG tablet  Class 3 severe obesity due to excess calories without serious comorbidity with body mass index (BMI) of 45.0 to 49.9 in adult [facility]) -discussed ways to reduce caloric intake and increase physical activity -pt to follow through on cooking more/going to [employer]' class -given handout on area restaurants.   H/O carpal tunnel repair  -stable -will likely need repair on L  wrist.  Pt to f/u with hand surgeon when ready.  Surveillence of contraceptive pills -Nortrel OCPs refilled  F/u prn in the next few months for CPE  Grier Mitts, MD

## 2018-11-17 ENCOUNTER — Encounter: Payer: 59 | Admitting: Family Medicine

## 2018-12-03 ENCOUNTER — Encounter: Payer: 59 | Admitting: Family Medicine

## 2019-01-25 ENCOUNTER — Other Ambulatory Visit: Payer: Self-pay | Admitting: Family Medicine

## 2019-01-25 DIAGNOSIS — F419 Anxiety disorder, unspecified: Secondary | ICD-10-CM

## 2019-06-14 ENCOUNTER — Telehealth: Payer: Self-pay | Admitting: Family Medicine

## 2019-06-14 DIAGNOSIS — F419 Anxiety disorder, unspecified: Secondary | ICD-10-CM

## 2019-06-16 NOTE — Telephone Encounter (Signed)
Ok to send refills per pt pharmacy

## 2019-06-17 ENCOUNTER — Other Ambulatory Visit: Payer: Self-pay

## 2019-06-17 DIAGNOSIS — F419 Anxiety disorder, unspecified: Secondary | ICD-10-CM

## 2019-06-17 MED ORDER — CITALOPRAM HYDROBROMIDE 40 MG PO TABS: 40.0000 mg | ORAL_TABLET | Freq: Every day | ORAL | 0 refills | Status: DC

## 2019-06-17 MED ORDER — NORTREL 7/7/7 0.5/0.75/1-35 MG-MCG PO TABS: 1.0000 | ORAL_TABLET | Freq: Every day | ORAL | 0 refills | Status: DC

## 2019-06-17 NOTE — Telephone Encounter (Signed)
Rx sent 

## 2019-06-17 NOTE — Telephone Encounter (Signed)
Ok for 1 month supply until appt.

## 2019-06-17 NOTE — Telephone Encounter (Signed)
Please advise if ok for refill?

## 2019-06-17 NOTE — Telephone Encounter (Signed)
Pt following up on request for this and also needs  norethindrone-ethinyl estradiol (NORTREL 7/7/7) 0.5/0.75/1-35 MG-MCG tablet  Pt has made appt for 06/24/19 but requesting this rx along with citalopram (CELEXA) 40 MG tablet  Be sent today to   [facility] [place] [id number] (Phone) [phone] (Fax)

## 2019-06-24 ENCOUNTER — Other Ambulatory Visit: Payer: Self-pay

## 2019-06-24 ENCOUNTER — Telehealth (INDEPENDENT_AMBULATORY_CARE_PROVIDER_SITE_OTHER): Payer: 59 | Admitting: Family Medicine

## 2019-06-24 DIAGNOSIS — Z6841 Body Mass Index (BMI) 40.0 and over, adult: Secondary | ICD-10-CM | POA: Diagnosis not present

## 2019-06-24 DIAGNOSIS — E66813 Obesity, class 3: Secondary | ICD-10-CM

## 2019-06-24 DIAGNOSIS — F419 Anxiety disorder, unspecified: Secondary | ICD-10-CM | POA: Diagnosis not present

## 2019-06-24 DIAGNOSIS — J302 Other seasonal allergic rhinitis: Secondary | ICD-10-CM

## 2019-06-24 MED ORDER — FLUTICASONE PROPIONATE 50 MCG/ACT NA SUSP: 2.0000 | Freq: Every day | NASAL | 4 refills | Status: DC

## 2019-06-24 MED ORDER — CITALOPRAM HYDROBROMIDE 40 MG PO TABS: 40.0000 mg | ORAL_TABLET | Freq: Every day | ORAL | 3 refills | Status: DC

## 2019-06-24 NOTE — Progress Notes (Signed)
Virtual Visit via Video Note  I connected with Erika Mullins on 06/24/19 at  8:00 AM EDT by a video enabled telemedicine application 2/2 [id number] pandemic and verified that I am speaking with the correct person using two identifiers.  Location patient: home Location provider:work or home office Persons participating in the virtual visit: patient, provider  I discussed the limitations of evaluation and management by telemedicine and the availability of in person appointments. The patient expressed understanding and agreed to proceed.   HPI: Pt is a 43 yo female with pmh sig obesity, anxiety, h/o carpal tunnel who is following up on anxiety.  Pt states she feels more tired recently.  States mood, energy, and concentration are good.  Pt started a ballet class and a is doing a [place] program where she has until February to walk, bike, or kayak 450 miles.    Pt having increased allergy symptoms.  Was taking sudafed daily.  Allegra makes her shaky.  Zyrtec and claritin did not work for her.  Pt was taking flonase in the past.   ROS: See pertinent positives and negatives per HPI.  Past Medical History:  Diagnosis Date  . Adult acne   . Dysfunctional uterine bleeding   . Obesity     Past Surgical History:  Procedure Laterality Date  . CARPAL TUNNEL RELEASE  09/2010   Right  . TONSILLECTOMY AND ADENOIDECTOMY    . TYMPANOSTOMY TUBE PLACEMENT     bilateral    Family History  Problem Relation Age of Onset  . Hyperlipidemia Father   . Heart disease Father   . Obesity Father   . Cancer Maternal Grandfather        Pancreatic  . Hyperlipidemia Maternal Grandfather   . Heart attack Maternal Grandfather   . Hyperlipidemia Unknown   . Hypertension Unknown   . Obesity Unknown   . Stroke Maternal Grandmother   . Breast cancer Neg Hx      Current Outpatient Medications:  .  calcium carbonate (OS-CAL) 600 MG TABS, Take 600 mg by mouth 2 (two) times daily with a meal.,  Disp: , Rfl:  .  cetirizine (ZYRTEC) 10 MG tablet, Take 1 tablet (10 mg total) by mouth daily., Disp: 100 tablet, Rfl: 4 .  citalopram (CELEXA) 40 MG tablet, Take 1 tablet (40 mg total) by mouth daily., Disp: 30 tablet, Rfl: 0 .  fluticasone (FLONASE) 50 MCG/ACT nasal spray, Place 2 sprays into both nostrils daily., Disp: 48 g, Rfl: 4 .  ibuprofen (ADVIL,MOTRIN) 200 MG tablet, Take 600-800 mg by mouth every 6 (six) hours as needed for pain., Disp: , Rfl:  .  loperamide (IMODIUM) 2 MG capsule, Take 2 mg by mouth 2 (two) times daily as needed for diarrhea or loose stools., Disp: , Rfl:  .  montelukast (SINGULAIR) 10 MG tablet, Take 1 tablet (10 mg total) by mouth at bedtime., Disp: 100 tablet, Rfl: 4 .  Multiple Vitamin (MULTIVITAMIN WITH MINERALS) TABS, Take 1 tablet by mouth every morning., Disp: , Rfl:  .  norethindrone-ethinyl estradiol (NORTREL 7/7/7) 0.5/0.75/1-35 MG-MCG tablet, Take 1 tablet by mouth daily., Disp: 28 tablet, Rfl: 0  EXAM:  VITALS per patient if applicable:  GENERAL: alert, oriented, appears well and in no acute distress  HEENT: atraumatic, conjunctiva clear, no obvious abnormalities on inspection of external nose and ears  NECK: normal movements of the head and neck  LUNGS: on inspection no signs of respiratory distress, breathing rate appears normal, no obvious  gross SOB, gasping or wheezing  CV: no obvious cyanosis  MS: moves all visible extremities without noticeable abnormality  PSYCH/NEURO: pleasant and cooperative, no obvious depression or anxiety, speech and thought processing grossly intact  ASSESSMENT AND PLAN:  Discussed the following assessment and plan:  Seasonal allergies  -consider saline nasal rinse, local honey -restart flonase -consider singulair if needed. - Plan: fluticasone (FLONASE) 50 MCG/ACT nasal spray  Anxiety  -stable -will obtain PHQ 9 and GAD 7 at next OFV - Plan: citalopram (CELEXA) 40 MG tablet  Class 3 severe obesity due  to excess calories without serious comorbidity with body mass index (BMI) of 45.0 to 49.9 in adult [facility]) -continue increasing physical activity and other lifestyle modifications  F/u prn in the next few months   I discussed the assessment and treatment plan with the patient. The patient was provided an opportunity to ask questions and all were answered. The patient agreed with the plan and demonstrated an understanding of the instructions.   The patient was advised to call back or seek an in-person evaluation if the symptoms worsen or if the condition fails to improve as anticipated.   Billie Ruddy, MD

## 2019-12-13 ENCOUNTER — Other Ambulatory Visit: Payer: Self-pay | Admitting: Family Medicine

## 2019-12-25 ENCOUNTER — Ambulatory Visit: Payer: 59 | Attending: Internal Medicine

## 2019-12-25 DIAGNOSIS — Z23 Encounter for immunization: Secondary | ICD-10-CM

## 2019-12-25 NOTE — Progress Notes (Signed)
   Covid-19 Vaccination Clinic  Name:  FARYN CHRZANOWSKI    MRN: [id number] DOB: 09-16-1976  12/25/2019  Ms. Ewan was observed post Covid-19 immunization for 15 minutes without incident. She was provided with Vaccine Information Sheet and instruction to access the V-Safe system.   Ms. Ross was instructed to call 911 with any severe reactions post vaccine: Marland Kitchen Difficulty breathing  . Swelling of face and throat  . A fast heartbeat  . A bad rash all over body  . Dizziness and weakness   Immunizations Administered    Name Date Dose VIS Date Route   Pfizer COVID-19 Vaccine 12/25/2019  9:08 AM 0.3 mL 09/30/2019 Intramuscular   Manufacturer: [place]   Lot: [id number]   [place]: [id number]

## 2020-01-17 ENCOUNTER — Ambulatory Visit: Payer: 59 | Attending: Internal Medicine

## 2020-01-17 DIAGNOSIS — Z23 Encounter for immunization: Secondary | ICD-10-CM

## 2020-01-17 NOTE — Progress Notes (Signed)
   Covid-19 Vaccination Clinic  Name:  Erika Mullins    MRN: [id number] DOB: 05/01/1976  01/17/2020  Erika Mullins was observed post Covid-19 immunization for 15 minutes without incident. She was provided with Vaccine Information Sheet and instruction to access the V-Safe system.   Erika Mullins was instructed to call 911 with any severe reactions post vaccine: Marland Kitchen Difficulty breathing  . Swelling of face and throat  . A fast heartbeat  . A bad rash all over body  . Dizziness and weakness   Immunizations Administered    Name Date Dose VIS Date Route   Pfizer COVID-19 Vaccine 01/17/2020  2:47 PM 0.3 mL 09/30/2019 Intramuscular   Manufacturer: [place]   Lot: [id number]   [place]: [id number]

## 2020-01-23 ENCOUNTER — Other Ambulatory Visit: Payer: Self-pay | Admitting: Family Medicine

## 2020-01-23 DIAGNOSIS — Z1231 Encounter for screening mammogram for malignant neoplasm of breast: Secondary | ICD-10-CM

## 2020-01-24 ENCOUNTER — Ambulatory Visit: Payer: Self-pay

## 2020-02-29 ENCOUNTER — Ambulatory Visit: Payer: 59

## 2020-03-02 ENCOUNTER — Other Ambulatory Visit: Payer: Self-pay

## 2020-03-02 ENCOUNTER — Ambulatory Visit
Admission: RE | Admit: 2020-03-02 | Discharge: 2020-03-02 | Disposition: A | Discharge status: Home / Self Care | Payer: 59 | Source: Ambulatory Visit | Attending: Family Medicine | Admitting: Family Medicine

## 2020-03-02 DIAGNOSIS — Z1231 Encounter for screening mammogram for malignant neoplasm of breast: Secondary | ICD-10-CM

## 2020-03-05 ENCOUNTER — Other Ambulatory Visit: Payer: Self-pay | Admitting: Family Medicine

## 2020-03-09 ENCOUNTER — Telehealth: Payer: 59 | Admitting: Family Medicine

## 2020-03-09 ENCOUNTER — Encounter: Payer: Self-pay | Admitting: Family Medicine

## 2020-03-09 NOTE — Progress Notes (Signed)
Attempted to start video visit via [place], however could not hear or see pt.  Attempts to reach pt via phone unsuccessful.

## 2020-03-12 ENCOUNTER — Telehealth: Payer: Self-pay | Admitting: Family Medicine

## 2020-03-12 ENCOUNTER — Other Ambulatory Visit: Payer: Self-pay

## 2020-03-12 MED ORDER — NORTREL 7/7/7 0.5/0.75/1-35 MG-MCG PO TABS: 1.0000 | ORAL_TABLET | Freq: Every day | ORAL | 0 refills | Status: DC

## 2020-03-12 NOTE — Telephone Encounter (Signed)
Pt is calling in stating that she needs a refill on nortrel    Pharm:  [facility].

## 2020-03-12 NOTE — Telephone Encounter (Signed)
Rx sent to pt pharmacy 

## 2020-03-14 ENCOUNTER — Telehealth (INDEPENDENT_AMBULATORY_CARE_PROVIDER_SITE_OTHER): Payer: 59 | Admitting: Family Medicine

## 2020-03-14 ENCOUNTER — Encounter: Payer: Self-pay | Admitting: Family Medicine

## 2020-03-14 ENCOUNTER — Telehealth: Payer: Self-pay | Admitting: Family Medicine

## 2020-03-14 DIAGNOSIS — F419 Anxiety disorder, unspecified: Secondary | ICD-10-CM

## 2020-03-14 DIAGNOSIS — J302 Other seasonal allergic rhinitis: Secondary | ICD-10-CM | POA: Diagnosis not present

## 2020-03-14 NOTE — Telephone Encounter (Signed)
Pt would like to do her labs for her annual cpe before her cpe appt on 06/14/20. I informe dpt that her PCP will have to put in orders and approve that before scheduling her CPE lab.   Pt can be reached at [phone]

## 2020-03-14 NOTE — Progress Notes (Signed)
Virtual Visit via Telephone Note Attempted visit via Theodosia Blender, how neither patient nor provider could hear nor see each other.  Patient subsequently called for visit.  I connected with Erika Mullins on 03/14/20 at  4:00 PM EDT by telephone and verified that I am speaking with the correct person using two identifiers.   I discussed the limitations, risks, security and privacy concerns of performing an evaluation and management service by telephone and the availability of in person appointments. I also discussed with the patient that there may be a patient responsible charge related to this service. The patient expressed understanding and agreed to proceed.  Location patient: home Location provider: work or home office Participants present for the call: patient, provider Patient did not have a visit in the prior 7 days to address this/these issue(s).   History of Present Illness: Pt is a 44 yo female with pmh sig for anxiety, seasonal allergies, h/o obesity.  Pt doing well.  Pt thinks she needs refills on meds including Celexa and OCPs.    Pt got both doses of Pfizer vaccine.  The 2nd one was in April.  Pt had some fatigue and a swollen lymph node.  Observations/Objective: Patient sounds cheerful and well on the phone. I do not appreciate any SOB. Speech and thought processing are grossly intact. Patient reported vitals:  Assessment and Plan: Seasonal allergies -Continue cetirizine and Flonase as needed  Anxiety -Stable -Continue Celexa 40 mg daily  Patient informs she is up-to-date on refills for at least 3 months.  Patient plans to schedule CPE.  Follow Up Instructions: Prn in the next few months for in office visit.   I did not refer this patient for an OV in the next 24 hours for this/these issue(s).  I discussed the assessment and treatment plan with the patient. The patient was provided an opportunity to ask questions and all were answered. The patient agreed with  the plan and demonstrated an understanding of the instructions.   The patient was advised to call back or seek an in-person evaluation if the symptoms worsen or if the condition fails to improve as anticipated.  I provided 6 minutes of non-face-to-face time during this encounter.   Billie Ruddy, MD

## 2020-03-16 NOTE — Telephone Encounter (Signed)
Left a detailed message for pt regarding her request on having labs done before her CPE, advised pt to call the office back

## 2020-06-14 ENCOUNTER — Encounter: Payer: 59 | Admitting: Family Medicine

## 2020-06-14 DIAGNOSIS — Z0289 Encounter for other administrative examinations: Secondary | ICD-10-CM

## 2020-07-08 ENCOUNTER — Other Ambulatory Visit: Payer: Self-pay | Admitting: Family Medicine

## 2020-07-08 DIAGNOSIS — F419 Anxiety disorder, unspecified: Secondary | ICD-10-CM

## 2020-07-25 ENCOUNTER — Other Ambulatory Visit: Payer: Self-pay | Admitting: Family Medicine

## 2020-08-07 ENCOUNTER — Ambulatory Visit: Payer: 59 | Admitting: Orthopaedic Surgery

## 2020-08-07 ENCOUNTER — Ambulatory Visit: Payer: Self-pay

## 2020-08-07 ENCOUNTER — Other Ambulatory Visit: Payer: Self-pay

## 2020-08-07 ENCOUNTER — Encounter: Payer: Self-pay | Admitting: Orthopaedic Surgery

## 2020-08-07 VITALS — Ht 68.0 in

## 2020-08-07 DIAGNOSIS — G5603 Carpal tunnel syndrome, bilateral upper limbs: Secondary | ICD-10-CM | POA: Insufficient documentation

## 2020-08-07 DIAGNOSIS — M25511 Pain in right shoulder: Secondary | ICD-10-CM | POA: Insufficient documentation

## 2020-08-07 DIAGNOSIS — Z6841 Body Mass Index (BMI) 40.0 and over, adult: Secondary | ICD-10-CM

## 2020-08-07 DIAGNOSIS — G8929 Other chronic pain: Secondary | ICD-10-CM

## 2020-08-07 NOTE — Progress Notes (Signed)
Office Visit Note   Patient: Erika Mullins           Date of Birth: [birth year]           MRN: [id number] Visit Date: 08/07/2020              Requested by: Erika Ruddy, MD Erika Mullins,  [place] [address] PCP: Erika Ruddy, MD   Assessment & Plan: Visit Diagnoses:  1. Chronic right shoulder pain   2. Carpal tunnel syndrome, bilateral   3. Class 3 severe obesity due to excess calories without serious comorbidity with body mass index (BMI) of 45.0 to 49.9 in adult [facility])     Plan: Erika Mullins has evidence of impingement symptoms right shoulder.  Will order course of physical therapy as she has had a similar pain in the past with therapy making a difference.  Also has a history of bilateral carpal tunnel with prior surgery in the right by Dr. Daylene Mullins over 7 years ago.  Having more trouble now on the left with numbness.  Will order EMGs and nerve conduction studies.  Follow-Up Instructions: Return After EMGs and nerve conduction studies left upper extremity.   Orders:  Orders Placed This Encounter  Procedures  . XR Shoulder Right  . Ambulatory referral to Physical Medicine Rehab  . Ambulatory referral to Physical Therapy   No orders of the defined types were placed in this encounter.     Procedures: No procedures performed   Clinical Data: No additional findings.   Subjective: Chief Complaint  Patient presents with  . Right Shoulder - Pain  Patient presents today for right shoulder pain. She said that it has been hurting for 5-89months. No recent injury, but she does recall injuring several years ago while playing kickball. She was reaching up quickly to catch a ball and injured it. She went through PT at the time. She said that her pain is located anteriorly and proximal humerus. She has good range of motion, but painful when she lifts her arm up high. She has bilateral carpal tunnel. She is right hand dominant.  No recent injury or trauma.   Physical therapy made a big difference with her right shoulder pain years ago and only has had recently recurrence.  She also relates having a prior diagnosis of bilateral carpal tunnel syndrome with prior surgery on the right side.  Presently having difficulty on the left with predominantly numbness in the radial 3 digits  HPI  Review of Systems   Objective: Vital Signs: Ht 5\' 8"  (1.727 m)   BMI 49.19 kg/m   Physical Exam Constitutional:      Appearance: She is well-developed.  Eyes:     Pupils: Pupils are equal, round, and reactive to light.  Pulmonary:     Effort: Pulmonary effort is normal.  Skin:    General: Skin is warm and dry.  Neurological:     Mental Status: She is alert and oriented to person, place, and time.  Psychiatric:        Behavior: Behavior normal.     Ortho Exam awake alert and oriented x3.  Comfortable sitting.  Able to place right arm overhead with minimal discomfort.  Minimally positive impingement on the extreme of internal and external rotation.  No evidence of instability or adhesive capsulitis.  Negative empty can testing and negative Speed sign.  Skin intact.  No pain at the [facility] joint.  Minimal discomfort in the anterior subacromial region  without any popping or clicking.  Biceps intact.  Good grip and release.  Positive Tinel over the median nerve at the left wrist.  Good grip and release.  Good opposition of thumb to little finger.  Full range of motion of digits and wrist  Specialty Comments:  No specialty comments available.  Imaging: XR Shoulder Right  Result Date: 08/07/2020 Films of the right shoulder obtained in 3 projections.  The humeral head is centered about the glenoid without any joint space narrowing or ectopic calcification.  Normal space between the humeral head and acromion.  No obvious degenerative change at the [facility] joint.  Type I-II acromium.  No acute changes    PMFS History: Patient Active Problem List   Diagnosis Date  Noted  . Pain in right shoulder 08/07/2020  . Carpal tunnel syndrome, bilateral 08/07/2020  . H/O carpal tunnel repair 11/04/2018  . Routine general medical examination at a health care facility 09/12/2014  . Anxiety 09/17/2012  . Class 3 severe obesity due to excess calories without serious comorbidity with body mass index (BMI) of 45.0 to 49.9 in adult [facility]) 06/30/2011   Past Medical History:  Diagnosis Date  . Adult acne   . Dysfunctional uterine bleeding   . Obesity     Family History  Problem Relation Age of Onset  . Hyperlipidemia Father   . Heart disease Father   . Obesity Father   . Cancer Maternal Grandfather        Pancreatic  . Hyperlipidemia Maternal Grandfather   . Heart attack Maternal Grandfather   . Hyperlipidemia Other   . Hypertension Other   . Obesity Other   . Stroke Maternal Grandmother   . Breast cancer Neg Hx     Past Surgical History:  Procedure Laterality Date  . CARPAL TUNNEL RELEASE  09/2010   Right  . TONSILLECTOMY AND ADENOIDECTOMY    . TYMPANOSTOMY TUBE PLACEMENT     bilateral   Social History   Occupational History  . Not on file  Tobacco Use  . Smoking status: Never Smoker  . Smokeless tobacco: Never Used  Substance and Sexual Activity  . Alcohol use: Yes    Comment: large glass of wine 3-4 nights/week  . Drug use: No  . Sexual activity: Not on file

## 2020-08-09 ENCOUNTER — Other Ambulatory Visit: Payer: Self-pay

## 2020-08-09 ENCOUNTER — Encounter: Payer: Self-pay | Admitting: Physical Therapy

## 2020-08-09 ENCOUNTER — Ambulatory Visit: Payer: 59 | Admitting: Physical Therapy

## 2020-08-09 DIAGNOSIS — M6281 Muscle weakness (generalized): Secondary | ICD-10-CM | POA: Diagnosis not present

## 2020-08-09 DIAGNOSIS — M25511 Pain in right shoulder: Secondary | ICD-10-CM | POA: Diagnosis not present

## 2020-08-09 DIAGNOSIS — G8929 Other chronic pain: Secondary | ICD-10-CM

## 2020-08-09 NOTE — Therapy (Signed)
[facility] Physical [facility] [address] [place], [place], [address] Phone: [phone]   Fax:  [phone]  Physical Therapy Evaluation  Patient Details  Name: Erika Mullins MRN: [id number] Date of Birth: 08-12-1976 Referring Provider (PT): Garald Balding, MD   Encounter Date: 08/09/2020   PT End of Session - 08/09/20 1104    Visit Number 1    Number of Visits 12    Date for PT Re-Evaluation 09/20/20    Authorization Type UHC    PT Start Time 1062    PT Stop Time 1055    PT Time Calculation (min) 40 min    Activity Tolerance Patient tolerated treatment well    Behavior During Therapy [facility] for tasks assessed/performed           Past Medical History:  Diagnosis Date  . Adult acne   . Dysfunctional uterine bleeding   . Obesity     Past Surgical History:  Procedure Laterality Date  . CARPAL TUNNEL RELEASE  09/2010   Right  . TONSILLECTOMY AND ADENOIDECTOMY    . TYMPANOSTOMY TUBE PLACEMENT     bilateral    There were no vitals filed for this visit.    Subjective Assessment - 08/09/20 1016    Subjective She relays about 6 months onset of Rt shoulder pain after kickball accident. She relays her pain comes and goes and gets sore and painful after computer work on reaching or sleeping her Rt arm raised up. Also has a history of bilateral carpal tunnel with prior surgery in the right by Dr. Daylene Katayama over 7 years ag. Pain and weakness in the morning that gets a little better with movements.    Pertinent History PMH: CTS with release    Limitations House hold activities;Reading    Diagnostic tests negative XR Rt shoulder 08/07/20    Patient Stated Goals reduce pain    Currently in Pain? Yes    Pain Score 6     Pain Location Shoulder    Pain Orientation Right    Pain Descriptors / Indicators Aching;Sharp    Pain Type Chronic pain    Pain Radiating Towards denies but does have N/T in fingers due to CTS    Pain Onset More than a month ago     Pain Frequency Intermittent    Aggravating Factors  see eval subjective    Multiple Pain Sites No              [facility] PT Assessment - 08/09/20 0001      Assessment   Medical Diagnosis right shoulder impingement    Referring Provider (PT) Garald Balding, MD    Onset Date/Surgical Date --   6 month onset of pain   Hand Dominance Right    Next MD Visit nothing scheduled    Prior Therapy none      Precautions   Precautions None      Balance Screen   Has the patient fallen in the past 6 months No    How many times? NO    Has the patient had a decrease in activity level because of a fear of falling?  No      Home Ecologist residence      Prior Function   Level of Independence Independent    Vocation Full time employment    Vocation Requirements computer work    Leisure workout, Regulatory affairs officer   Overall Cognitive Status  Within Functional Limits for tasks assessed      Observation/Other Assessments   Focus on Therapeutic Outcomes (FOTO)  65% functional intake      ROM / Strength   AROM / PROM / Strength AROM;Strength      AROM   Overall AROM Comments Rt shoulder ROM WNL      Strength   Strength Assessment Site Shoulder    Right/Left Shoulder Right    Right Shoulder Flexion 4+/5    Right Shoulder Extension 4/5    Right Shoulder ABduction 4/5    Right Shoulder Internal Rotation 5/5    Right Shoulder External Rotation 4/5    Right Shoulder Horizontal ABduction 4/5      Palpation   Palpation comment TTP anterior and lateral Rt shoulder       Special Tests   Other special tests +impingment tests                      Objective measurements completed on examination: See above findings.       [facility] Adult PT Treatment/Exercise - 08/09/20 0001      Modalities   Modalities Electrical Stimulation;Moist Heat      Moist Heat Therapy   Number Minutes Moist Heat 12 Minutes    Moist Heat Location Shoulder        Electrical Stimulation   Electrical Stimulation Location Rt shoulder    Electrical Stimulation Action IFC    Electrical Stimulation Parameters tolreance in sitting with heat 12 min    Electrical Stimulation Goals Pain                  PT Education - 08/09/20 1103    Education Details HEP, TENS, POC, sleeping position so her Rt shoulder is not into IR    Person(s) Educated Patient    Methods Explanation    Comprehension Verbalized understanding;Need further instruction               PT Long Term Goals - 08/09/20 1108      PT LONG TERM GOAL #1   Title Pt will be I and compliant with HEP.    Time 6    Period Weeks    Status New    Target Date 09/20/20      PT LONG TERM GOAL #2   Title Pt will improve FOTO functional outcome measure score to 75%    Time 6    Period Weeks    Status New      PT LONG TERM GOAL #3   Title Pt will improve Rt shoulder strength to 5/5 MMT to improve function    Time 6    Period Weeks    Status New      PT LONG TERM GOAL #4   Title Pt will have less than 3/10 pain with ususal acvity, work, sleeping    Time 6    Period Weeks    Status New                  Plan - 08/09/20 1105    Clinical Impression Statement Pt presents with symptoms consistent with Rt shoulder impingment. She has Rt shoulder weakness, and pain limiting functional reaching and use of her Rt UE. She will benefit from skilled PT to address these deficits.    Personal Factors and Comorbidities Comorbidity 1;Past/Current Experience    Comorbidities CTS    Examination-Activity Limitations Carry;Lift;Sleep;Reach Overhead    Examination-Participation Restrictions Cleaning;Driving;Occupation  Stability/Clinical Decision Making Stable/Uncomplicated    Clinical Decision Making Low    Rehab Potential Good    PT Frequency 2x / week   1-2   PT Duration 8 weeks    PT Treatment/Interventions ADLs/Self Care Home Management;Electrical  Stimulation;Cryotherapy;Iontophoresis 4mg /ml Dexamethasone;Moist Heat;Ultrasound;Therapeutic activities;Therapeutic exercise;Neuromuscular re-education;Manual techniques;Passive range of motion;Dry needling;Joint Manipulations;Taping;Vasopneumatic Device    PT Next Visit Plan review and update HEP PRN. needs scapular stability and RTC strength.    PT Home Exercise Plan Access Code: [email or web address]    Consulted and Agree with Plan of Care Patient           Patient will benefit from skilled therapeutic intervention in order to improve the following deficits and impairments:  Decreased activity tolerance, Decreased strength, Increased fascial restricitons, Increased muscle spasms, Impaired flexibility, Pain  Visit Diagnosis: Chronic right shoulder pain  Muscle weakness (generalized)     Problem List Patient Active Problem List   Diagnosis Date Noted  . Pain in right shoulder 08/07/2020  . Carpal tunnel syndrome, bilateral 08/07/2020  . H/O carpal tunnel repair 11/04/2018  . Routine general medical examination at a health care facility 09/12/2014  . Anxiety 09/17/2012  . Class 3 severe obesity due to excess calories without serious comorbidity with body mass index (BMI) of 45.0 to 49.9 in adult [facility]) 06/30/2011    Silvestre Mesi 08/09/2020, 11:13 AM  [facility] [address] [place], [place], [address] Phone: [phone]   Fax:  [phone]  Name: Erika Mullins MRN: [id number] Date of Birth: September 30, 1976

## 2020-08-09 NOTE — Patient Instructions (Signed)
Access Code: [email or web address] URL: [email or web address] Date: 08/09/2020 Prepared by: Elsie Ra  Exercises Standing Shoulder Posterior Capsule Stretch - 2 x daily - 6 x weekly - 3 sets - 10 reps Doorway Pec Stretch at 90 Degrees Abduction - 2 x daily - 6 x weekly - 3 sets - 10 reps Standing Shoulder Row with Anchored Resistance - 2 x daily - 6 x weekly - 3 sets - 10 reps Shoulder Extension with Resistance - Neutral - 2 x daily - 6 x weekly - 3 sets - 10 reps Standing Shoulder External Rotation with Resistance - 2 x daily - 6 x weekly - 2-3 sets - 10 reps Prone Shoulder Horizontal Abduction - 2 x daily - 3-4 x weekly - 2-3 sets - 10 reps - 3 sec hold Prone Single Arm Shoulder Y - 2 x daily - 6 x weekly - 2-3 sets - 10 reps

## 2020-08-10 ENCOUNTER — Other Ambulatory Visit (HOSPITAL_COMMUNITY)
Admission: RE | Admit: 2020-08-10 | Discharge: 2020-08-10 | Disposition: A | Discharge status: Home / Self Care | Payer: 59 | Source: Ambulatory Visit | Attending: Family Medicine | Admitting: Family Medicine

## 2020-08-10 ENCOUNTER — Ambulatory Visit (INDEPENDENT_AMBULATORY_CARE_PROVIDER_SITE_OTHER): Payer: 59 | Admitting: Family Medicine

## 2020-08-10 ENCOUNTER — Encounter: Payer: Self-pay | Admitting: Family Medicine

## 2020-08-10 VITALS — BP 110/80 | HR 80 | Temp 98.3°F | Ht 68.0 in | Wt 336.0 lb

## 2020-08-10 DIAGNOSIS — Z124 Encounter for screening for malignant neoplasm of cervix: Secondary | ICD-10-CM | POA: Insufficient documentation

## 2020-08-10 DIAGNOSIS — E782 Mixed hyperlipidemia: Secondary | ICD-10-CM | POA: Diagnosis not present

## 2020-08-10 DIAGNOSIS — F419 Anxiety disorder, unspecified: Secondary | ICD-10-CM

## 2020-08-10 DIAGNOSIS — Z6841 Body Mass Index (BMI) 40.0 and over, adult: Secondary | ICD-10-CM

## 2020-08-10 DIAGNOSIS — R5383 Other fatigue: Secondary | ICD-10-CM

## 2020-08-10 DIAGNOSIS — Z0000 Encounter for general adult medical examination without abnormal findings: Secondary | ICD-10-CM

## 2020-08-10 DIAGNOSIS — J302 Other seasonal allergic rhinitis: Secondary | ICD-10-CM | POA: Diagnosis not present

## 2020-08-10 NOTE — Progress Notes (Signed)
Subjective:     Erika Mullins is a 44 y.o. female and is here for a comprehensive physical exam. The patient notes history of allergies that seem worse in the last few weeks.  Patient taking Zyrtec.  In the past Allegra made her shaky and Xyzal caused her to feel extremely tired.  Patient has Flonase at home but has not been using it.  Patient trying to lose weight, exercising two times per week and [facility] Patient taking Celexa 40 mg daily. Endorses good mood and sleep. In the past patient tried to wean off Celexa but has been unable to get started to feel bad. Wishes to continue with medication.  Last Pap 2017.  LMP 07/29/2020.  Mammogram 03/02/2020.  Patient endorses having frequent influenza vaccine and two doses of Covid vaccine around April.  Inquires about booster vaccine.  Social History   Socioeconomic History  . Marital status: Single    Spouse name: Not on file  . Number of children: Not on file  . Years of education: Not on file  . Highest education level: Not on file  Occupational History  . Not on file  Tobacco Use  . Smoking status: Never Smoker  . Smokeless tobacco: Never Used  Substance and Sexual Activity  . Alcohol use: Yes    Comment: large glass of wine 3-4 nights/week  . Drug use: No  . Sexual activity: Not on file  Other Topics Concern  . Not on file  Social History Narrative  . Not on file   Social Determinants of Health   Financial Resource Strain:   . Difficulty of Paying Living Expenses: Not on file  Food Insecurity:   . Worried About Charity fundraiser in the Last Year: Not on file  . Ran Out of Food in the Last Year: Not on file  Transportation Needs:   . Lack of Transportation (Medical): Not on file  . Lack of Transportation (Non-Medical): Not on file  Physical Activity:   . Days of Exercise per Week: Not on file  . Minutes of Exercise per Session: Not on file  Stress:   . Feeling of Stress : Not on file  Social  Connections:   . Frequency of Communication with Friends and Family: Not on file  . Frequency of Social Gatherings with Friends and Family: Not on file  . Attends Religious Services: Not on file  . Active Member of Clubs or Organizations: Not on file  . Attends Archivist Meetings: Not on file  . Marital Status: Not on file  Intimate Partner Violence:   . Fear of Current or Ex-Partner: Not on file  . Emotionally Abused: Not on file  . Physically Abused: Not on file  . Sexually Abused: Not on file   Health Maintenance  Topic Date Due  . Hepatitis C Screening  Never done  . HIV Screening  Never done  . PAP SMEAR-Modifier  09/10/2019  . TETANUS/TDAP  06/29/2021  . INFLUENZA VACCINE  Completed  . COVID-19 Vaccine  Completed    The following portions of the patient's history were reviewed and updated as appropriate: allergies, current medications, past family history, past medical history, past social history, past surgical history and problem list.  Review of Systems Pertinent items noted in HPI and remainder of comprehensive ROS otherwise negative.   Objective:    BP 110/80 (BP Location: Left Arm, Patient Position: Sitting, Cuff Size: Large)   Pulse 80  Temp 98.3 F (36.8 C) (Oral)   Ht 5\' 8"  (1.727 m)   Wt (!) 336 lb (152.4 kg)   LMP 07/29/2020 (Approximate)   SpO2 97%   BMI 51.09 kg/m  General appearance: alert, cooperative and no distress Head: Normocephalic, without obvious abnormality, atraumatic Eyes: conjunctivae/corneas clear. PERRL, EOM's intact. Fundi benign. Ears: normal TM's and external ear canals both ears Nose: Nares normal. Septum midline. Mucosa normal. No drainage or sinus tenderness. Throat: lips, mucosa, and tongue normal; teeth and gums normal Neck: no adenopathy, no carotid bruit, no JVD, supple, symmetrical, trachea midline and thyroid not enlarged, symmetric, no tenderness/mass/nodules Lungs: clear to auscultation bilaterally Heart:  regular rate and rhythm, S1, S2 normal, no murmur, click, rub or gallop Abdomen: soft, non-tender; bowel sounds normal; no masses,  no organomegaly Pelvic: cervix normal in appearance, exam obscured by obesity, external genitalia normal, no adnexal masses or tenderness, no cervical motion tenderness, rectovaginal septum normal, uterus normal size, shape, and consistency and vagina normal without discharge Extremities: extremities normal, atraumatic, no cyanosis or edema Pulses: 2+ and symmetric Skin: Skin color, texture, turgor normal. No rashes or lesions Lymph nodes: Cervical, supraclavicular, and axillary nodes normal. Neurologic: Alert and oriented X 3, normal strength and tone. Normal symmetric reflexes. Normal coordination and gait    Assessment:    Healthy female exam.     Plan:     Anticipatory guidance given including wearing seatbelts, smoke detectors in the home, increasing physical activity, increasing p.o. intake of water and vegetables. -We will obtain labs -Pap done this visit.  Last pap in 2017 without HPV testing. -Mammogram up-to-date.  Done 03/02/2020 -COVID-19 vaccines complete x2. -Influenza vaccine up-to-date per patient. -Given handout -Next CPE in 1 year See After Visit Summary for Counseling Recommendations    Cervical cancer screening  - Plan: PAP [[place]]  Seasonal allergies -continue Zyrtec daily -discussed restarting Flonase, local honey, and/or saline nasal rinse -for continued or worsening allergy symptoms consider Singulair  Anxiety -PHQ-9 score 3-GAD-7 score of 0 -continue Celexa 40 mg daily -discussed weaning off Celexa, however patient has been unable to in the past. -Continue to monitor - Plan: TSH, T4, Free  Mixed hyperlipidemia -lifestyle modifications encouraged - Plan: Lipid panel  Class III severe obesity with BMI 50.0-59.9, adult, unspecified whether serious comorbidity present ([place]) -discussed lifestyle  modifications -patient encouraged to increase physical activity consistently - Plan: Hemoglobin A1c  Fatigue, unspecified type -discussed various causes -patient encouraged to increase physical activity - Plan: Vitamin D, 25-hydroxy  F/u prn  Grier Mitts, MD

## 2020-08-10 NOTE — Patient Instructions (Signed)
Preventive Care 40-42 Years Old, Female Preventive care refers to visits with your health care provider and lifestyle choices that can promote health and wellness. This includes:  A yearly physical exam. This may also be called an annual well check.  Regular dental visits and eye exams.  Immunizations.  Screening for certain conditions.  Healthy lifestyle choices, such as eating a healthy diet, getting regular exercise, not using drugs or products that contain nicotine and tobacco, and limiting alcohol use. What can I expect for my preventive care visit? Physical exam Your health care provider will check your:  Height and weight. This may be used to calculate body mass index (BMI), which tells if you are at a healthy weight.  Heart rate and blood pressure.  Skin for abnormal spots. Counseling Your health care provider may ask you questions about your:  Alcohol, tobacco, and drug use.  Emotional well-being.  Home and relationship well-being.  Sexual activity.  Eating habits.  Work and work environment.  Method of birth control.  Menstrual cycle.  Pregnancy history. What immunizations do I need?  Influenza (flu) vaccine  This is recommended every year. Tetanus, diphtheria, and pertussis (Tdap) vaccine  You may need a Td booster every 10 years. Varicella (chickenpox) vaccine  You may need this if you have not been vaccinated. Zoster (shingles) vaccine  You may need this after age 60. Measles, mumps, and rubella (MMR) vaccine  You may need at least one dose of MMR if you were born in 1957 or later. You may also need a second dose. Pneumococcal conjugate (PCV13) vaccine  You may need this if you have certain conditions and were not previously vaccinated. Pneumococcal polysaccharide (PPSV23) vaccine  You may need one or two doses if you smoke cigarettes or if you have certain conditions. Meningococcal conjugate (MenACWY) vaccine  You may need this if you  have certain conditions. Hepatitis A vaccine  You may need this if you have certain conditions or if you travel or work in places where you may be exposed to hepatitis A. Hepatitis B vaccine  You may need this if you have certain conditions or if you travel or work in places where you may be exposed to hepatitis B. Haemophilus influenzae type b (Hib) vaccine  You may need this if you have certain conditions. Human papillomavirus (HPV) vaccine  If recommended by your health care provider, you may need three doses over 6 months. You may receive vaccines as individual doses or as more than one vaccine together in one shot (combination vaccines). Talk with your health care provider about the risks and benefits of combination vaccines. What tests do I need? Blood tests  Lipid and cholesterol levels. These may be checked every 5 years, or more frequently if you are over 50 years old.  Hepatitis C test.  Hepatitis B test. Screening  Lung cancer screening. You may have this screening every year starting at age 55 if you have a 30-pack-year history of smoking and currently smoke or have quit within the past 15 years.  Colorectal cancer screening. All adults should have this screening starting at age 50 and continuing until age 75. Your health care provider may recommend screening at age 45 if you are at increased risk. You will have tests every 1-10 years, depending on your results and the type of screening test.  Diabetes screening. This is done by checking your blood sugar (glucose) after you have not eaten for a while (fasting). You may have this   done every 1-3 years.  Mammogram. This may be done every 1-2 years. Talk with your health care provider about when you should start having regular mammograms. This may depend on whether you have a family history of breast cancer.  BRCA-related cancer screening. This may be done if you have a family history of breast, ovarian, tubal, or peritoneal  cancers.  Pelvic exam and Pap test. This may be done every 3 years starting at age 16. Starting at age 44, this may be done every 5 years if you have a Pap test in combination with an HPV test. Other tests  Sexually transmitted disease (STD) testing.  Bone density scan. This is done to screen for osteoporosis. You may have this scan if you are at high risk for osteoporosis. Follow these instructions at home: Eating and drinking  Eat a diet that includes fresh fruits and vegetables, whole grains, lean protein, and low-fat dairy.  Take vitamin and mineral supplements as recommended by your health care provider.  Do not drink alcohol if: ? Your health care provider tells you not to drink. ? You are pregnant, may be pregnant, or are planning to become pregnant.  If you drink alcohol: ? Limit how much you have to 0-1 drink a day. ? Be aware of how much alcohol is in your drink. In the [place] one drink equals one 12 oz bottle of beer (355 mL), one 5 oz glass of wine (148 mL), or one 1 oz glass of hard liquor (44 mL). Lifestyle  Take daily care of your teeth and gums.  Stay active. Exercise for at least 30 minutes on 5 or more days each week.  Do not use any products that contain nicotine or tobacco, such as cigarettes, e-cigarettes, and chewing tobacco. If you need help quitting, ask your health care provider.  If you are sexually active, practice safe sex. Use a condom or other form of birth control (contraception) in order to prevent pregnancy and STIs (sexually transmitted infections).  If told by your health care provider, take low-dose aspirin daily starting at age 31. What's next?  Visit your health care provider once a year for a well check visit.  Ask your health care provider how often you should have your eyes and teeth checked.  Stay up to date on all vaccines. This information is not intended to replace advice given to you by your health care provider. Make sure you  discuss any questions you have with your health care provider. Document Revised: 06/17/2018 Document Reviewed: 06/17/2018 Elsevier Patient Education  [place], Adult An allergy is when your body's defense system (immune system) overreacts to an otherwise harmless substance (allergen) that you breathe in or eat or something that touches your skin. When you come into contact with something that you are allergic to, your immune system produces certain proteins (antibodies). These proteins cause cells to release chemicals (histamines) that trigger the symptoms of an allergic reaction. Allergies often affect the nasal passages (allergic rhinitis), eyes (allergic conjunctivitis), skin (atopic dermatitis), and stomach. Allergies can be mild or severe. Allergies cannot spread from person to person (are not contagious). They can develop at any age and may be outgrown. What increases the risk? You may be at greater risk of allergies if other people in your family have allergies. What are the signs or symptoms? Symptoms depend on what type of allergy you have. They may include:  Runny, stuffy nose.  Sneezing.  Itchy mouth, ears, or  throat.  Postnasal drip.  Sore throat.  Itchy, red, watery, or puffy eyes.  Skin rash or hives.  Stomach pain.  Vomiting.  Diarrhea.  Bloating.  Wheezing or coughing. People with a severe allergy to food, medicine, or an insect bite may have a life-threatening allergic reaction (anaphylaxis). Symptoms of anaphylaxis include:  Hives.  Itching.  Flushed face.  Swollen lips, tongue, or mouth.  Tight or swollen throat.  Chest pain or tightness in the chest.  Trouble breathing or shortness of breath.  Rapid heartbeat.  Dizziness or fainting.  Vomiting.  Diarrhea.  Pain in the abdomen. How is this diagnosed? This condition is diagnosed based on:  Your symptoms.  Your family and medical history.  A physical exam. You  may need to see a health care provider who specializes in treating allergies (allergist). You may also have tests, including:  Skin tests to see which allergens are causing your symptoms, such as: ? Skin prick test. In this test, your skin is pricked with a tiny needle and exposed to small amounts of possible allergens to see if your skin reacts. ? Intradermal skin test. In this test, a small amount of allergen is injected under your skin to see if your skin reacts. ? Patch test. In this test, a small amount of allergen is placed on your skin and then your skin is covered with a bandage. Your health care provider will check your skin after a couple of days to see if a rash has developed.  Blood tests.  Challenges tests. In this test, you inhale a small amount of allergen by mouth to see if you have an allergic reaction. You may also be asked to:  Keep a food diary. A food diary is a record of all the foods and drinks you have in a day and any symptoms you experience.  Practice an elimination diet. An elimination diet involves eliminating specific foods from your diet and then adding them back in one by one to find out if a certain food causes an allergic reaction. How is this treated? Treatment for allergies depends on your symptoms. Treatment may include:  Cold compresses to soothe itching and swelling.  Eye drops.  Nasal sprays.  Using a saline spray or container (neti pot) to flush out the nose (nasal irrigation). These methods can help clear away mucus and keep the nasal passages moist.  Using a humidifier.  Oral antihistamines or other medicines to block allergic reaction and inflammation.  Skin creams to treat rashes or itching.  Diet changes to eliminate food allergy triggers.  Repeated exposure to tiny amounts of allergens to build up a tolerance and prevent future allergic reactions (immunotherapy). These include: ? Allergy shots. ? Oral treatment. This involves taking  small doses of an allergen under the tongue (sublingual immunotherapy).  Emergency epinephrine injection (auto-injector) in case of an allergic emergency. This is a self-injectable, pre-measured medicine that must be given within the first few minutes of a serious allergic reaction. Follow these instructions at home:         Avoid known allergens whenever possible.  If you suffer from airborne allergens, wash out your nose daily. You can do this with a saline spray or a neti pot to flush out your nose (nasal irrigation).  Take over-the-counter and prescription medicines only as told by your health care provider.  Keep all follow-up visits as told by your health care provider. This is important.  If you are at risk of a  severe allergic reaction (anaphylaxis), keep your auto-injector with you at all times.  If you have ever had anaphylaxis, wear a medical alert bracelet or necklace that states you have a severe allergy. Contact a health care provider if:  Your symptoms do not improve with treatment. Get help right away if:  You have symptoms of anaphylaxis, such as: ? Swollen mouth, tongue, or throat. ? Pain or tightness in your chest. ? Trouble breathing or shortness of breath. ? Dizziness or fainting. ? Severe abdominal pain, vomiting, or diarrhea. This information is not intended to replace advice given to you by your health care provider. Make sure you discuss any questions you have with your health care provider. Document Revised: 12/30/2017 Document Reviewed: 04/23/2016 Elsevier Patient Education  [place].

## 2020-08-11 LAB — CBC WITH DIFFERENTIAL/PLATELET
Absolute Monocytes: 806 cells/uL (ref 200–950)
Basophils Absolute: 70 cells/uL (ref 0–200)
Basophils Relative: 0.5 %
Eosinophils Absolute: 278 cells/uL (ref 15–500)
Eosinophils Relative: 2 %
HCT: 39.2 % (ref 35.0–45.0)
Hemoglobin: 12.9 g/dL (ref 11.7–15.5)
Lymphs Abs: 3836 cells/uL (ref 850–3900)
MCH: 28.2 pg (ref 27.0–33.0)
MCHC: 32.9 g/dL (ref 32.0–36.0)
MCV: 85.8 fL (ref 80.0–100.0)
MPV: 9.1 fL (ref 7.5–12.5)
Monocytes Relative: 5.8 %
Neutro Abs: 8910 cells/uL — ABNORMAL HIGH (ref 1500–7800)
Neutrophils Relative %: 64.1 %
Platelets: 603 10*3/uL — ABNORMAL HIGH (ref 140–400)
RBC: 4.57 10*6/uL (ref 3.80–5.10)
RDW: 11.8 % (ref 11.0–15.0)
Total Lymphocyte: 27.6 %
WBC: 13.9 10*3/uL — ABNORMAL HIGH (ref 3.8–10.8)

## 2020-08-11 LAB — HEMOGLOBIN A1C
Hgb A1c MFr Bld: 6 % of total Hgb — ABNORMAL HIGH (ref <5.7)
Mean Plasma Glucose: 126 (calc)
eAG (mmol/L): 7 (calc)

## 2020-08-11 LAB — BASIC METABOLIC PANEL
BUN: 8 mg/dL (ref 7–25)
CO2: 23 mmol/L (ref 20–32)
Calcium: 9.4 mg/dL (ref 8.6–10.2)
Chloride: 102 mmol/L (ref 98–110)
Creat: 0.71 mg/dL (ref 0.50–1.10)
Glucose, Bld: 100 mg/dL — ABNORMAL HIGH (ref 65–99)
Potassium: 4.9 mmol/L (ref 3.5–5.3)
Sodium: 137 mmol/L (ref 135–146)

## 2020-08-11 LAB — LIPID PANEL
Cholesterol: 237 mg/dL — ABNORMAL HIGH (ref <200)
HDL: 44 mg/dL — ABNORMAL LOW (ref >=50)
LDL Cholesterol (Calc): 153 mg/dL (calc) — ABNORMAL HIGH
Non-HDL Cholesterol (Calc): 193 mg/dL (calc) — ABNORMAL HIGH (ref <130)
Total CHOL/HDL Ratio: 5.4 (calc) — ABNORMAL HIGH (ref <5.0)
Triglycerides: 237 mg/dL — ABNORMAL HIGH (ref <150)

## 2020-08-11 LAB — T4, FREE: Free T4: 1 ng/dL (ref 0.8–1.8)

## 2020-08-11 LAB — VITAMIN D 25 HYDROXY (VIT D DEFICIENCY, FRACTURES): Vit D, 25-Hydroxy: 21 ng/mL — ABNORMAL LOW (ref 30–100)

## 2020-08-11 LAB — TSH: TSH: 2.01 mIU/L

## 2020-08-13 LAB — CYTOLOGY - PAP
Comment: NEGATIVE
Diagnosis: NEGATIVE
High risk HPV: NEGATIVE

## 2020-08-14 ENCOUNTER — Other Ambulatory Visit: Payer: Self-pay | Admitting: Family Medicine

## 2020-08-14 ENCOUNTER — Encounter: Payer: Self-pay | Admitting: Family Medicine

## 2020-08-14 DIAGNOSIS — R7303 Prediabetes: Secondary | ICD-10-CM | POA: Insufficient documentation

## 2020-08-14 DIAGNOSIS — E559 Vitamin D deficiency, unspecified: Secondary | ICD-10-CM

## 2020-08-14 DIAGNOSIS — D75839 Thrombocytosis, unspecified: Secondary | ICD-10-CM

## 2020-08-14 DIAGNOSIS — D72828 Other elevated white blood cell count: Secondary | ICD-10-CM

## 2020-08-14 MED ORDER — VITAMIN D (ERGOCALCIFEROL) 1.25 MG (50000 UNIT) PO CAPS: 50000.0000 [IU] | ORAL_CAPSULE | ORAL | 0 refills | Status: DC

## 2020-08-15 ENCOUNTER — Telehealth: Payer: Self-pay | Admitting: Physical Medicine and Rehabilitation

## 2020-08-15 NOTE — Telephone Encounter (Signed)
Patient returning call to [facility] to set appt with Dr. Ernestina Patches. Please call patient at [phone].

## 2020-08-16 NOTE — Telephone Encounter (Signed)
Called pt and lvm #1 

## 2020-08-20 ENCOUNTER — Encounter: Payer: 59 | Admitting: Physical Therapy

## 2020-08-21 ENCOUNTER — Telehealth: Payer: Self-pay | Admitting: Physical Medicine and Rehabilitation

## 2020-08-21 NOTE — Telephone Encounter (Signed)
Pt called asking to have Sunday Corn try her again to set an appt   [phone]

## 2020-08-22 ENCOUNTER — Other Ambulatory Visit: Payer: Self-pay

## 2020-08-22 ENCOUNTER — Ambulatory Visit (INDEPENDENT_AMBULATORY_CARE_PROVIDER_SITE_OTHER): Payer: 59 | Admitting: Physical Therapy

## 2020-08-22 DIAGNOSIS — G8929 Other chronic pain: Secondary | ICD-10-CM | POA: Diagnosis not present

## 2020-08-22 DIAGNOSIS — M25511 Pain in right shoulder: Secondary | ICD-10-CM

## 2020-08-22 DIAGNOSIS — M6281 Muscle weakness (generalized): Secondary | ICD-10-CM | POA: Diagnosis not present

## 2020-08-22 NOTE — Therapy (Signed)
[facility] Physical Therapy [address] [place], [place], [address] Phone: [phone]   Fax:  [phone]  Physical Therapy Treatment  Patient Details  Name: Erika Mullins MRN: [id number] Date of Birth: Apr 25, 1976 Referring Provider (PT): Garald Balding, MD   Encounter Date: 08/22/2020   PT End of Session - 08/22/20 0849    Visit Number 2    Number of Visits 12    Date for PT Re-Evaluation 09/20/20    Authorization Type UHC    PT Start Time 0813    PT Stop Time 0851    PT Time Calculation (min) 38 min    Activity Tolerance Patient tolerated treatment well    Behavior During Therapy [facility] for tasks assessed/performed           Past Medical History:  Diagnosis Date  . Adult acne   . Dysfunctional uterine bleeding   . Obesity     Past Surgical History:  Procedure Laterality Date  . CARPAL TUNNEL RELEASE  09/2010   Right  . TONSILLECTOMY AND ADENOIDECTOMY    . TYMPANOSTOMY TUBE PLACEMENT     bilateral    There were no vitals filed for this visit.   Subjective Assessment - 08/22/20 0846    Subjective Pt  ar[place] to [place] 12 minutes late due to stuck in traffic. Pt reporting less pain over the past week. No pain reported today.    Pertinent History PMH: CTS with release    Limitations House hold activities;Reading    Diagnostic tests negative XR Rt shoulder 08/07/20    Currently in Pain? No/denies                             [facility] Adult PT Treatment/Exercise - 08/22/20 0001      Posture/Postural Control   Posture/Postural Control Postural limitations    Postural Limitations Rounded Shoulders;Forward head      Exercises   Exercises Shoulder      Shoulder Exercises: Supine   Flexion AAROM;Both;20 reps    ABduction AAROM;Right;20 reps      Shoulder Exercises: Standing   Other Standing Exercises wall slides x 15       Shoulder Exercises: Isometric Strengthening   Flexion 5X5"    Extension 5X5"    External  Rotation 5X5"    Internal Rotation 5X5"      Modalities   Modalities Electrical Stimulation;Moist Heat      Moist Heat Therapy   Number Minutes Moist Heat 10 Minutes    Moist Heat Location Shoulder      Electrical Stimulation   Electrical Stimulation Location R shoulder    Electrical Stimulation Action IFC    Electrical Stimulation Parameters sitting     Electrical Stimulation Goals Pain                       PT Long Term Goals - 08/22/20 1034      PT LONG TERM GOAL #1   Title Pt will be I and compliant with HEP.    Time 6    Period Weeks    Status On-going      PT LONG TERM GOAL #2   Title Pt will improve FOTO functional outcome measure score to 75%    Status On-going      PT LONG TERM GOAL #3   Title Pt will improve Rt shoulder strength to 5/5 MMT to improve function  Status On-going      PT LONG TERM GOAL #4   Title Pt will have less than 3/10 pain with ususal acvity, work, sleeping    Status On-going                 Plan - 08/22/20 1032    Clinical Impression Statement Pt arriving to therapy reporting no pain at rest and reporting having a good week. Still reporting intermittent pain in R shoulder. Pt tolerating exericses well and reporting less stiffnes and relief following E-stim. Continue skilled PT.    Personal Factors and Comorbidities Comorbidity 1;Past/Current Experience    Comorbidities CTS    Examination-Activity Limitations Carry;Lift;Sleep;Reach Overhead    Examination-Participation Restrictions Cleaning;Driving;Occupation    Stability/Clinical Decision Making Stable/Uncomplicated    Rehab Potential Good    PT Frequency 2x / week    PT Duration 8 weeks    PT Treatment/Interventions ADLs/Self Care Home Management;Electrical Stimulation;Cryotherapy;Iontophoresis 4mg /ml Dexamethasone;Moist Heat;Ultrasound;Therapeutic activities;Therapeutic exercise;Neuromuscular re-education;Manual techniques;Passive range of motion;Dry  needling;Joint Manipulations;Taping;Vasopneumatic Device    PT Next Visit Plan review and update HEP PRN. needs scapular stability and RTC strength.    PT Home Exercise Plan Access Code: [email or web address]    Consulted and Agree with Plan of Care Patient           Patient will benefit from skilled therapeutic intervention in order to improve the following deficits and impairments:  Decreased activity tolerance, Decreased strength, Increased fascial restricitons, Increased muscle spasms, Impaired flexibility, Pain  Visit Diagnosis: Chronic right shoulder pain  Muscle weakness (generalized)     Problem List Patient Active Problem List   Diagnosis Date Noted  . Prediabetes 08/14/2020  . Mixed hyperlipidemia 08/10/2020  . Seasonal allergies 08/10/2020  . Pain in right shoulder 08/07/2020  . Carpal tunnel syndrome, bilateral 08/07/2020  . H/O carpal tunnel repair 11/04/2018  . Routine general medical examination at a health care facility 09/12/2014  . Anxiety 09/17/2012  . Class 3 severe obesity due to excess calories with body mass index (BMI) of 50.0 to 59.9 in adult [facility]) 06/30/2011    Oretha Caprice PT, MPT 08/22/2020, 10:36 AM  [facility] Physical Therapy [address] [place], [place], [address] Phone: [phone]   Fax:  [phone]  Name: Erika Mullins MRN: [id number] Date of Birth: 02-06-1976

## 2020-08-27 ENCOUNTER — Other Ambulatory Visit: Payer: Self-pay

## 2020-08-27 ENCOUNTER — Encounter: Payer: Self-pay | Admitting: Physical Therapy

## 2020-08-27 ENCOUNTER — Ambulatory Visit (INDEPENDENT_AMBULATORY_CARE_PROVIDER_SITE_OTHER): Payer: 59 | Admitting: Physical Therapy

## 2020-08-27 DIAGNOSIS — M6281 Muscle weakness (generalized): Secondary | ICD-10-CM

## 2020-08-27 DIAGNOSIS — M25511 Pain in right shoulder: Secondary | ICD-10-CM | POA: Diagnosis not present

## 2020-08-27 DIAGNOSIS — G8929 Other chronic pain: Secondary | ICD-10-CM | POA: Diagnosis not present

## 2020-08-27 NOTE — Therapy (Signed)
[facility] Physical Therapy [address] [place], [place], [address] Phone: [phone]   Fax:  [phone]  Physical Therapy Treatment  Patient Details  Name: Erika Mullins MRN: [id number] Date of Birth: [birth year] Referring Provider (PT): Garald Balding, MD   Encounter Date: 08/27/2020   PT End of Session - 08/27/20 0813    Visit Number 3    Number of Visits 12    Date for PT Re-Evaluation 09/20/20    Authorization Type UHC    Progress Note Due on Visit 10    PT Start Time 0801    PT Stop Time 0843    PT Time Calculation (min) 42 min    Activity Tolerance Patient tolerated treatment well    Behavior During Therapy [facility] for tasks assessed/performed           Past Medical History:  Diagnosis Date  . Adult acne   . Dysfunctional uterine bleeding   . Obesity     Past Surgical History:  Procedure Laterality Date  . CARPAL TUNNEL RELEASE  09/2010   Right  . TONSILLECTOMY AND ADENOIDECTOMY    . TYMPANOSTOMY TUBE PLACEMENT     bilateral    There were no vitals filed for this visit.   Subjective Assessment - 08/27/20 0811    Subjective Pt arriving to therapy reporting no pain and good response following last session. Pt reporting her pain comes and goes and is no longer constant.    Pertinent History PMH: CTS with release    Limitations House hold activities;Reading    Diagnostic tests negative XR Rt shoulder 08/07/20    Patient Stated Goals reduce pain    Currently in Pain? No/denies    Pain Onset More than a month ago                             [facility] Adult PT Treatment/Exercise - 08/27/20 0001      Exercises   Exercises Shoulder      Shoulder Exercises: Supine   Flexion AAROM;Both;20 reps    ABduction AAROM;Right;20 reps    Other Supine Exercises rhythmic stabilization with shoulder flexed at 90 degrees.     Other Supine Exercises serratus punches in clockwise and counter clockwise direction       Shoulder  Exercises: Standing   Other Standing Exercises wall slides x 15, standing UE ranger circles clockwise and counter clockwise.     Other Standing Exercises wall ladder x 5      Shoulder Exercises: Pulleys   Flexion 3 minutes    Scaption 2 minutes      Shoulder Exercises: Isometric Strengthening   Flexion 5X5"    Extension 5X5"    External Rotation 5X5"    Internal Rotation 5X5"      Shoulder Exercises: Stretch   Other Shoulder Stretches door stretch elbow at 90 degrees.       Modalities   Modalities Electrical Stimulation;Moist Heat      Moist Heat Therapy   Number Minutes Moist Heat 10 Minutes    Moist Heat Location Shoulder      Electrical Stimulation   Electrical Stimulation Location R shoulder    Electrical Stimulation Action IFC    Electrical Stimulation Parameters sitting    Electrical Stimulation Goals Pain                       PT Long Term Goals - 08/27/20 8338  PT LONG TERM GOAL #1   Title Pt will be I and compliant with HEP.    Time 6    Period Weeks    Status On-going      PT LONG TERM GOAL #2   Title Pt will improve FOTO functional outcome measure score to 75%    Status On-going      PT LONG TERM GOAL #3   Title Pt will improve Rt shoulder strength to 5/5 MMT to improve function    Status On-going      PT LONG TERM GOAL #4   Title Pt will have less than 3/10 pain with ususal acvity, work, sleeping    Status On-going                 Plan - 08/27/20 0814    Clinical Impression Statement Pt tolerating exercises well today reporting decreasd in pain frequency. Pt reporting bouts of pain intermittenly at home but overall improved. No goals met this visit. Still progressing toward LTG's set.    Personal Factors and Comorbidities Comorbidity 1;Past/Current Experience    Comorbidities CTS    Examination-Activity Limitations Carry;Lift;Sleep;Reach Overhead    Stability/Clinical Decision Making Stable/Uncomplicated    Rehab  Potential Good    PT Frequency 2x / week    PT Duration 8 weeks    PT Treatment/Interventions ADLs/Self Care Home Management;Electrical Stimulation;Cryotherapy;Iontophoresis 41m/ml Dexamethasone;Moist Heat;Ultrasound;Therapeutic activities;Therapeutic exercise;Neuromuscular re-education;Manual techniques;Passive range of motion;Dry needling;Joint Manipulations;Taping;Vasopneumatic Device    PT Next Visit Plan review and update HEP PRN. needs scapular stability and RTC strength.    PT Home Exercise Plan Access Code: [email or web address]   Consulted and Agree with Plan of Care Patient           Patient will benefit from skilled therapeutic intervention in order to improve the following deficits and impairments:  Decreased activity tolerance, Decreased strength, Increased fascial restricitons, Increased muscle spasms, Impaired flexibility, Pain  Visit Diagnosis: Chronic right shoulder pain  Muscle weakness (generalized)     Problem List Patient Active Problem List   Diagnosis Date Noted  . Prediabetes 08/14/2020  . Mixed hyperlipidemia 08/10/2020  . Seasonal allergies 08/10/2020  . Pain in right shoulder 08/07/2020  . Carpal tunnel syndrome, bilateral 08/07/2020  . H/O carpal tunnel repair 11/04/2018  . Routine general medical examination at a health care facility 09/12/2014  . Anxiety 09/17/2012  . Class 3 severe obesity due to excess calories with body mass index (BMI) of 50.0 to 59.9 in adult ([facility] 06/30/2011    JOretha Caprice PT, MPT 08/27/2020, 8:34 AM  [facility] [address] [address]Phone: [phone]  Fax:  [phone] Name: Erika STEPPMRN: [id number] of Birth: 116-Apr-1977

## 2020-08-29 ENCOUNTER — Encounter: Payer: 59 | Admitting: Physical Therapy

## 2020-09-03 ENCOUNTER — Encounter: Payer: Self-pay | Admitting: Physical Therapy

## 2020-09-03 ENCOUNTER — Other Ambulatory Visit: Payer: Self-pay

## 2020-09-03 ENCOUNTER — Ambulatory Visit: Payer: 59 | Admitting: Physical Therapy

## 2020-09-03 DIAGNOSIS — M25511 Pain in right shoulder: Secondary | ICD-10-CM | POA: Diagnosis not present

## 2020-09-03 DIAGNOSIS — M6281 Muscle weakness (generalized): Secondary | ICD-10-CM | POA: Diagnosis not present

## 2020-09-03 DIAGNOSIS — G8929 Other chronic pain: Secondary | ICD-10-CM | POA: Diagnosis not present

## 2020-09-03 NOTE — Therapy (Signed)
[facility] Physical Therapy [address] [place], [place], [address] Phone: [phone]   Fax:  [phone]  Physical Therapy Treatment  Patient Details  Name: Erika Mullins MRN: [id number] Date of Birth: 1976-07-26 Referring Provider (PT): Garald Balding, MD   Encounter Date: 09/03/2020   PT End of Session - 09/03/20 0843    Visit Number 4    Number of Visits 12    Date for PT Re-Evaluation 09/20/20    Authorization Type UHC    Progress Note Due on Visit 10    PT Start Time 0802    PT Stop Time 0845    PT Time Calculation (min) 43 min    Activity Tolerance Patient tolerated treatment well    Behavior During Therapy [facility] for tasks assessed/performed           Past Medical History:  Diagnosis Date  . Adult acne   . Dysfunctional uterine bleeding   . Obesity     Past Surgical History:  Procedure Laterality Date  . CARPAL TUNNEL RELEASE  09/2010   Right  . TONSILLECTOMY AND ADENOIDECTOMY    . TYMPANOSTOMY TUBE PLACEMENT     bilateral    There were no vitals filed for this visit.   Subjective Assessment - 09/03/20 0836    Subjective Pt arriving reporting 5/10 pain in R shoulder.    Pertinent History PMH: CTS with release    Limitations House hold activities;Reading    Diagnostic tests negative XR Rt shoulder 08/07/20    Currently in Pain? Yes    Pain Score 5     Pain Location Shoulder    Pain Orientation Right    Pain Descriptors / Indicators Aching    Pain Type Chronic pain    Pain Onset More than a month ago    Pain Frequency Intermittent                             [facility] Adult PT Treatment/Exercise - 09/03/20 0001      Exercises   Exercises Shoulder      Shoulder Exercises: Supine   Flexion AAROM;Both;20 reps    ABduction AAROM;Right;20 reps    Other Supine Exercises rhythmic stabilization with shoulder flexed at 90 degrees.       Shoulder Exercises: Standing   Other Standing Exercises wall slides x  15, standing UE ranger circles clockwise and counter clockwise.     Other Standing Exercises wall ladder x 5      Shoulder Exercises: Stretch   Other Shoulder Stretches door stretch elbow at 90 degrees.       Modalities   Modalities Electrical Stimulation      Moist Heat Therapy   Number Minutes Moist Heat 10 Minutes    Moist Heat Location Shoulder      Electrical Stimulation   Electrical Stimulation Location R shoulder    Electrical Stimulation Action IFC    Electrical Stimulation Parameters sitting, 80-150 Hz x 10 minutes    Electrical Stimulation Goals Pain                  PT Education - 09/03/20 0842    Education Details Exercise techniques    Person(s) Educated Patient    Methods Explanation    Comprehension Verbalized understanding;Returned demonstration               PT Long Term Goals - 08/27/20 0623      PT  LONG TERM GOAL #1   Title Pt will be I and compliant with HEP.    Time 6    Period Weeks    Status On-going      PT LONG TERM GOAL #2   Title Pt will improve FOTO functional outcome measure score to 75%    Status On-going      PT LONG TERM GOAL #3   Title Pt will improve Rt shoulder strength to 5/5 MMT to improve function    Status On-going      PT LONG TERM GOAL #4   Title Pt will have less than 3/10 pain with ususal acvity, work, sleeping    Status On-going                 Plan - 09/03/20 0843    Clinical Impression Statement Pt reporting relief in R shoulder following therapy. Pt stating overall her shoulder has improved. R shoulder flexion passive ROM was measured 170 degrees. Continue skilled PT.    Personal Factors and Comorbidities Comorbidity 1;Past/Current Experience    Comorbidities CTS    Examination-Activity Limitations Carry;Lift;Sleep;Reach Overhead    Examination-Participation Restrictions Cleaning;Driving;Occupation    Stability/Clinical Decision Making Stable/Uncomplicated    Rehab Potential Good    PT  Frequency 2x / week    PT Duration 8 weeks    PT Treatment/Interventions ADLs/Self Care Home Management;Electrical Stimulation;Cryotherapy;Iontophoresis 4mg /ml Dexamethasone;Moist Heat;Ultrasound;Therapeutic activities;Therapeutic exercise;Neuromuscular re-education;Manual techniques;Passive range of motion;Dry needling;Joint Manipulations;Taping;Vasopneumatic Device    PT Next Visit Plan review and update HEP PRN. needs scapular stability and RTC strength.    PT Home Exercise Plan Access Code: [email or web address]    Consulted and Agree with Plan of Care Patient           Patient will benefit from skilled therapeutic intervention in order to improve the following deficits and impairments:  Decreased activity tolerance, Decreased strength, Increased fascial restricitons, Increased muscle spasms, Impaired flexibility, Pain  Visit Diagnosis: Chronic right shoulder pain  Muscle weakness (generalized)     Problem List Patient Active Problem List   Diagnosis Date Noted  . Prediabetes 08/14/2020  . Mixed hyperlipidemia 08/10/2020  . Seasonal allergies 08/10/2020  . Pain in right shoulder 08/07/2020  . Carpal tunnel syndrome, bilateral 08/07/2020  . H/O carpal tunnel repair 11/04/2018  . Routine general medical examination at a health care facility 09/12/2014  . Anxiety 09/17/2012  . Class 3 severe obesity due to excess calories with body mass index (BMI) of 50.0 to 59.9 in adult [facility]) 06/30/2011    Oretha Caprice, PT, MPT 09/03/2020, 8:48 AM  [facility] Physical Therapy [address] [place], [place], [address] Phone: [phone]   Fax:  [phone]  Name: Erika Mullins MRN: [id number] Date of Birth: [birth year]

## 2020-09-05 ENCOUNTER — Other Ambulatory Visit: Payer: Self-pay

## 2020-09-05 ENCOUNTER — Encounter: Payer: Self-pay | Admitting: Physical Therapy

## 2020-09-05 ENCOUNTER — Ambulatory Visit: Payer: 59 | Admitting: Physical Therapy

## 2020-09-05 DIAGNOSIS — G8929 Other chronic pain: Secondary | ICD-10-CM

## 2020-09-05 DIAGNOSIS — M25511 Pain in right shoulder: Secondary | ICD-10-CM | POA: Diagnosis not present

## 2020-09-05 DIAGNOSIS — M6281 Muscle weakness (generalized): Secondary | ICD-10-CM | POA: Diagnosis not present

## 2020-09-05 NOTE — Therapy (Signed)
[facility] Physical Therapy [address] [place], [place], [address] Phone: [phone]   Fax:  [phone]  Physical Therapy Treatment  Patient Details  Name: Erika Mullins MRN: [id number] Date of Birth: 02-27-1976 Referring Provider (PT): Garald Balding, MD   Encounter Date: 09/05/2020   PT End of Session - 09/05/20 0841    Visit Number 5    Number of Visits 12    Date for PT Re-Evaluation 09/20/20    Authorization Type UHC    PT Start Time 0801    PT Stop Time 0833    PT Time Calculation (min) 32 min    Activity Tolerance Patient tolerated treatment well    Behavior During Therapy [facility] for tasks assessed/performed           Past Medical History:  Diagnosis Date  . Adult acne   . Dysfunctional uterine bleeding   . Obesity     Past Surgical History:  Procedure Laterality Date  . CARPAL TUNNEL RELEASE  09/2010   Right  . TONSILLECTOMY AND ADENOIDECTOMY    . TYMPANOSTOMY TUBE PLACEMENT     bilateral    There were no vitals filed for this visit.   Subjective Assessment - 09/05/20 0813    Subjective Pt arriving to therapy reporting no pain.    Pertinent History PMH: CTS with release    Limitations House hold activities;Reading    Diagnostic tests negative XR Rt shoulder 08/07/20    Currently in Pain? No/denies                             [facility] Adult PT Treatment/Exercise - 09/05/20 0001      Exercises   Exercises Shoulder      Shoulder Exercises: Standing   Flexion 20 reps;Theraband    Theraband Level (Shoulder Flexion) Level 2 (Red)    Extension Strengthening;Both;20 reps;Weights;Other (comment)    Extension Limitations 3# bar    Row Strengthening;20 reps;Theraband    Theraband Level (Shoulder Row) Level 3 (Green)    Other Standing Exercises wall push ups x 10     Other Standing Exercises wall ladder x 5      Shoulder Exercises: Pulleys   Flexion 2 minutes    ABduction 2 minutes      Shoulder  Exercises: Power Tower   Other Power Tower Exercises wood chops both directions x 12 5#                       PT Long Term Goals - 08/27/20 0833      PT LONG TERM GOAL #1   Title Pt will be I and compliant with HEP.    Time 6    Period Weeks    Status On-going      PT LONG TERM GOAL #2   Title Pt will improve FOTO functional outcome measure score to 75%    Status On-going      PT LONG TERM GOAL #3   Title Pt will improve Rt shoulder strength to 5/5 MMT to improve function    Status On-going      PT LONG TERM GOAL #4   Title Pt will have less than 3/10 pain with ususal acvity, work, sleeping    Status On-going                 Plan - 09/05/20 0845    Clinical Impression Statement Pt arriving reporting  she needs to leave early for a meeting at work. Pt reporting no pain. Pt tolerating more strengthening exercises. Mild "popping" noticed while performing standing extension and rows. Continue skilled PT progressing toward LTG's.    Personal Factors and Comorbidities Comorbidity 1;Past/Current Experience    Examination-Activity Limitations Carry;Lift;Sleep;Reach Overhead    Stability/Clinical Decision Making Stable/Uncomplicated    Rehab Potential Good    PT Frequency 2x / week    PT Duration 8 weeks    PT Treatment/Interventions ADLs/Self Care Home Management;Electrical Stimulation;Cryotherapy;Iontophoresis 4mg /ml Dexamethasone;Moist Heat;Ultrasound;Therapeutic activities;Therapeutic exercise;Neuromuscular re-education;Manual techniques;Passive range of motion;Dry needling;Joint Manipulations;Taping;Vasopneumatic Device    PT Next Visit Plan review and update HEP PRN. needs scapular stability and RTC strength.    PT Home Exercise Plan Access Code: [email or web address]    Consulted and Agree with Plan of Care Patient           Patient will benefit from skilled therapeutic intervention in order to improve the following deficits and impairments:  Decreased activity  tolerance, Decreased strength, Increased fascial restricitons, Increased muscle spasms, Impaired flexibility, Pain  Visit Diagnosis: Chronic right shoulder pain  Muscle weakness (generalized)     Problem List Patient Active Problem List   Diagnosis Date Noted  . Prediabetes 08/14/2020  . Mixed hyperlipidemia 08/10/2020  . Seasonal allergies 08/10/2020  . Pain in right shoulder 08/07/2020  . Carpal tunnel syndrome, bilateral 08/07/2020  . H/O carpal tunnel repair 11/04/2018  . Routine general medical examination at a health care facility 09/12/2014  . Anxiety 09/17/2012  . Class 3 severe obesity due to excess calories with body mass index (BMI) of 50.0 to 59.9 in adult [facility]) 06/30/2011    Oretha Caprice , PT, MPT 09/05/2020, 8:49 AM  [facility] Physical Therapy [address] [place], [place], [address] Phone: [phone]   Fax:  [phone]  Name: Erika Mullins MRN: [id number] Date of Birth: 12-Aug-1976

## 2020-09-07 ENCOUNTER — Other Ambulatory Visit: Payer: Self-pay

## 2020-09-07 ENCOUNTER — Ambulatory Visit: Payer: 59 | Admitting: Physical Medicine and Rehabilitation

## 2020-09-07 ENCOUNTER — Encounter: Payer: Self-pay | Admitting: Physical Medicine and Rehabilitation

## 2020-09-07 DIAGNOSIS — R202 Paresthesia of skin: Secondary | ICD-10-CM

## 2020-09-07 NOTE — Procedures (Signed)
EMG & NCV Findings: Evaluation of the left median motor nerve showed reduced amplitude (4.8 mV).  The left median (across palm) sensory nerve showed prolonged distal peak latency (Wrist, 3.7 ms).  All remaining nerves (as indicated in the following tables) were within normal limits.    All examined muscles (as indicated in the following table) showed no evidence of electrical instability.    Impression: The above electrodiagnostic study is ABNORMAL and reveals evidence of a mild left median nerve entrapment at the wrist (carpal tunnel syndrome) affecting sensory components. There is no significant electrodiagnostic evidence of any other focal nerve entrapment, brachial plexopathy or cervical radiculopathy.   Recommendations: 1.  Follow-up with referring physician. 2.  Continue current management of symptoms. 3.  Continue use of resting splint at night-time and as needed during the day.  ___________________________ Wonda Olds Board Certified, American Board of Physical Medicine and Rehabilitation    Nerve Conduction Studies Anti Sensory Summary Table   Stim Site NR Peak (ms) Norm Peak (ms) P-T Amp (V) Norm P-T Amp Site1 Site2 Delta-P (ms) Dist (cm) Vel (m/s) Norm Vel (m/s)  Left Median Acr Palm Anti Sensory (2nd Digit)  30.4C  Wrist    *3.7 <3.6 34.4 >10 Wrist Palm 1.9 0.0    Palm    1.8 <2.0 38.1         Left Radial Anti Sensory (Base 1st Digit)  30.6C  Wrist    2.1 <3.1 19.8  Wrist Base 1st Digit 2.1 0.0    Left Ulnar Anti Sensory (5th Digit)  31C  Wrist    3.2 <3.7 24.4 >15.0 Wrist 5th Digit 3.2 14.0 44 >38   Motor Summary Table   Stim Site NR Onset (ms) Norm Onset (ms) O-P Amp (mV) Norm O-P Amp Site1 Site2 Delta-0 (ms) Dist (cm) Vel (m/s) Norm Vel (m/s)  Left Median Motor (Abd Poll Brev)  30.8C  Wrist    3.8 <4.2 *4.8 >5 Elbow Wrist 3.6 22.0 61 >50  Elbow    7.4  4.5         Left Ulnar Motor (Abd Dig Min)  31C  Wrist    2.7 <4.2 9.2 >3 B Elbow Wrist 3.6 21.0 58  >53  B Elbow    6.3  7.7  A Elbow B Elbow 1.7 11.0 65 >53  A Elbow    8.0  8.3          EMG   Side Muscle Nerve Root Ins Act Fibs Psw Amp Dur Poly Recrt Int Fraser Din Comment  Left Abd Poll Brev Median C8-T1 Nml Nml Nml Nml Nml 0 Nml Nml   Left 1stDorInt Ulnar C8-T1 Nml Nml Nml Nml Nml 0 Nml Nml   Left PronatorTeres Median C6-7 Nml Nml Nml Nml Nml 0 Nml Nml   Left Biceps Musculocut C5-6 Nml Nml Nml Nml Nml 0 Nml Nml   Left Deltoid Axillary C5-6 Nml Nml Nml Nml Nml 0 Nml Nml     Nerve Conduction Studies Anti Sensory Left/Right Comparison   Stim Site L Lat (ms) R Lat (ms) L-R Lat (ms) L Amp (V) R Amp (V) L-R Amp (%) Site1 Site2 L Vel (m/s) R Vel (m/s) L-R Vel (m/s)  Median Acr Palm Anti Sensory (2nd Digit)  30.4C  Wrist *3.7   34.4   Wrist Palm     Palm 1.8   38.1         Radial Anti Sensory (Base 1st Digit)  30.6C  Wrist 2.1  19.8   Wrist Base 1st Digit     Ulnar Anti Sensory (5th Digit)  31C  Wrist 3.2   24.4   Wrist 5th Digit 44     Motor Left/Right Comparison   Stim Site L Lat (ms) R Lat (ms) L-R Lat (ms) L Amp (mV) R Amp (mV) L-R Amp (%) Site1 Site2 L Vel (m/s) R Vel (m/s) L-R Vel (m/s)  Median Motor (Abd Poll Brev)  30.8C  Wrist 3.8   *4.8   Elbow Wrist 61    Elbow 7.4   4.5         Ulnar Motor (Abd Dig Min)  31C  Wrist 2.7   9.2   B Elbow Wrist 58    B Elbow 6.3   7.7   A Elbow B Elbow 65    A Elbow 8.0   8.3            Waveforms:

## 2020-09-07 NOTE — Progress Notes (Signed)
Left hand numbness. Worse at night. Has had release on right. Still has some numbness on right.  Right hand dominant No lotion per patient Numeric Pain Rating Scale and Functional Assessment Average Pain 8   In the last MONTH (on 0-10 scale) has pain interfered with the following?  1. General activity like being  able to carry out your everyday physical activities such as walking, climbing stairs, carrying groceries, or moving a chair?  Rating(2)

## 2020-09-10 NOTE — Progress Notes (Signed)
Erika Mullins - 44 y.o. female MRN [id number]  Date of birth: [birth year]  Office Visit Note: Visit Date: 09/07/2020 PCP: Billie Ruddy, MD Referred by: Billie Ruddy, MD  Subjective: Chief Complaint  Patient presents with  . Left Hand - Numbness   HPI:  Erika Mullins is a 44 y.o. female who comes in today at the request of Dr. Joni Fears for electrodiagnostic study of the Left upper extremities.  Patient is Right hand dominant.  Patient complains only of left hand numbness particularly worse at night.  She did have a carpal tunnel release on the right side and has done well after that.  She rates her symptoms as an 8 out of 10.  She denies any frank radicular symptoms and feels like the left hand is very similar to how her right hand was prior to the surgery.  ROS Otherwise per HPI.  Assessment & Plan: Visit Diagnoses:  1. Paresthesia of skin     Plan:  Impression: The above electrodiagnostic study is ABNORMAL and reveals evidence of a mild left median nerve entrapment at the wrist (carpal tunnel syndrome) affecting sensory components. There is no significant electrodiagnostic evidence of any other focal nerve entrapment, brachial plexopathy or cervical radiculopathy.   Recommendations: 1.  Follow-up with referring physician. 2.  Continue current management of symptoms. 3.  Continue use of resting splint at night-time and as needed during the day.  Meds & Orders: No orders of the defined types were placed in this encounter.   Orders Placed This Encounter  Procedures  . NCV with EMG (electromyography)    Follow-up: Return for Joni Fears, MD.   Procedures: No procedures performed  EMG & NCV Findings: Evaluation of the left median motor nerve showed reduced amplitude (4.8 mV).  The left median (across palm) sensory nerve showed prolonged distal peak latency (Wrist, 3.7 ms).  All remaining nerves (as indicated in the following tables) were within normal  limits.    All examined muscles (as indicated in the following table) showed no evidence of electrical instability.    Impression: The above electrodiagnostic study is ABNORMAL and reveals evidence of a mild left median nerve entrapment at the wrist (carpal tunnel syndrome) affecting sensory components. There is no significant electrodiagnostic evidence of any other focal nerve entrapment, brachial plexopathy or cervical radiculopathy.   Recommendations: 1.  Follow-up with referring physician. 2.  Continue current management of symptoms. 3.  Continue use of resting splint at night-time and as needed during the day.  ___________________________ Wonda Olds Board Certified, American Board of Physical Medicine and Rehabilitation    Nerve Conduction Studies Anti Sensory Summary Table   Stim Site NR Peak (ms) Norm Peak (ms) P-T Amp (V) Norm P-T Amp Site1 Site2 Delta-P (ms) Dist (cm) Vel (m/s) Norm Vel (m/s)  Left Median Acr Palm Anti Sensory (2nd Digit)  30.4C  Wrist    *3.7 <3.6 34.4 >10 Wrist Palm 1.9 0.0    Palm    1.8 <2.0 38.1         Left Radial Anti Sensory (Base 1st Digit)  30.6C  Wrist    2.1 <3.1 19.8  Wrist Base 1st Digit 2.1 0.0    Left Ulnar Anti Sensory (5th Digit)  31C  Wrist    3.2 <3.7 24.4 >15.0 Wrist 5th Digit 3.2 14.0 44 >38   Motor Summary Table   Stim Site NR Onset (ms) Norm Onset (ms) O-P Amp (mV) Norm O-P Amp Site1  Site2 Delta-0 (ms) Dist (cm) Vel (m/s) Norm Vel (m/s)  Left Median Motor (Abd Poll Brev)  30.8C  Wrist    3.8 <4.2 *4.8 >5 Elbow Wrist 3.6 22.0 61 >50  Elbow    7.4  4.5         Left Ulnar Motor (Abd Dig Min)  31C  Wrist    2.7 <4.2 9.2 >3 B Elbow Wrist 3.6 21.0 58 >53  B Elbow    6.3  7.7  A Elbow B Elbow 1.7 11.0 65 >53  A Elbow    8.0  8.3          EMG   Side Muscle Nerve Root Ins Act Fibs Psw Amp Dur Poly Recrt Int Fraser Din Comment  Left Abd Poll Brev Median C8-T1 Nml Nml Nml Nml Nml 0 Nml Nml   Left 1stDorInt Ulnar C8-T1 Nml Nml  Nml Nml Nml 0 Nml Nml   Left PronatorTeres Median C6-7 Nml Nml Nml Nml Nml 0 Nml Nml   Left Biceps Musculocut C5-6 Nml Nml Nml Nml Nml 0 Nml Nml   Left Deltoid Axillary C5-6 Nml Nml Nml Nml Nml 0 Nml Nml     Nerve Conduction Studies Anti Sensory Left/Right Comparison   Stim Site L Lat (ms) R Lat (ms) L-R Lat (ms) L Amp (V) R Amp (V) L-R Amp (%) Site1 Site2 L Vel (m/s) R Vel (m/s) L-R Vel (m/s)  Median Acr Palm Anti Sensory (2nd Digit)  30.4C  Wrist *3.7   34.4   Wrist Palm     Palm 1.8   38.1         Radial Anti Sensory (Base 1st Digit)  30.6C  Wrist 2.1   19.8   Wrist Base 1st Digit     Ulnar Anti Sensory (5th Digit)  31C  Wrist 3.2   24.4   Wrist 5th Digit 44     Motor Left/Right Comparison   Stim Site L Lat (ms) R Lat (ms) L-R Lat (ms) L Amp (mV) R Amp (mV) L-R Amp (%) Site1 Site2 L Vel (m/s) R Vel (m/s) L-R Vel (m/s)  Median Motor (Abd Poll Brev)  30.8C  Wrist 3.8   *4.8   Elbow Wrist 61    Elbow 7.4   4.5         Ulnar Motor (Abd Dig Min)  31C  Wrist 2.7   9.2   B Elbow Wrist 58    B Elbow 6.3   7.7   A Elbow B Elbow 65    A Elbow 8.0   8.3            Waveforms:             Clinical History: No specialty comments available.     Objective:  VS:  HT:    WT:   BMI:     BP:   HR: bpm  TEMP: ( )  RESP:  Physical Exam Constitutional:      Appearance: She is obese.  Musculoskeletal:        General: No swelling, tenderness or deformity.     Comments: Inspection reveals no atrophy of the bilateral APB or FDI or hand intrinsics. There is no swelling, color changes, allodynia or dystrophic changes. There is 5 out of 5 strength in the bilateral wrist extension, finger abduction and long finger flexion. There is intact sensation to light touch in all dermatomal and peripheral nerve distributions.  There is a negative Hoffmann's test bilaterally.  Skin:    General: Skin is warm and dry.     Findings: No erythema or rash.  Neurological:     General: No focal  deficit present.     Mental Status: She is alert and oriented to person, place, and time.     Motor: No weakness or abnormal muscle tone.     Coordination: Coordination normal.  Psychiatric:        Mood and Affect: Mood normal.        Behavior: Behavior normal.      Imaging: No results found.

## 2020-09-11 ENCOUNTER — Ambulatory Visit: Payer: 59 | Admitting: Orthopaedic Surgery

## 2020-09-18 ENCOUNTER — Encounter: Payer: 59 | Admitting: Physical Therapy

## 2020-09-20 ENCOUNTER — Other Ambulatory Visit: Payer: Self-pay

## 2020-09-20 ENCOUNTER — Ambulatory Visit: Payer: 59 | Admitting: Orthopaedic Surgery

## 2020-09-20 ENCOUNTER — Ambulatory Visit: Payer: Self-pay

## 2020-09-20 ENCOUNTER — Encounter: Payer: Self-pay | Admitting: Orthopaedic Surgery

## 2020-09-20 VITALS — Ht 68.0 in | Wt 336.0 lb

## 2020-09-20 DIAGNOSIS — M25511 Pain in right shoulder: Secondary | ICD-10-CM | POA: Diagnosis not present

## 2020-09-20 DIAGNOSIS — G5603 Carpal tunnel syndrome, bilateral upper limbs: Secondary | ICD-10-CM | POA: Diagnosis not present

## 2020-09-20 DIAGNOSIS — M25561 Pain in right knee: Secondary | ICD-10-CM

## 2020-09-20 DIAGNOSIS — S8001XA Contusion of right knee, initial encounter: Secondary | ICD-10-CM | POA: Insufficient documentation

## 2020-09-20 NOTE — Progress Notes (Signed)
Office Visit Note   Patient: Erika Mullins           Date of Birth: [birth year]           MRN: [id number] Visit Date: 09/20/2020              Requested by: Billie Ruddy, MD [place],  [place] [id number] PCP: Billie Ruddy, MD   Assessment & Plan: Visit Diagnoses:  1. Acute pain of right knee   2. Right shoulder pain, unspecified chronicity   3. Carpal tunnel syndrome, bilateral   4. Contusion of right knee, initial encounter     Plan:  #1: Stay with conservative treatment in regards to her carpal tunnel and contusion of the right knee. #2: If she does not have improvement in the right shoulder we can either do a corticosteroid injection subacromially or order an MRI scan.  Follow-Up Instructions: No follow-ups on file.   Orders:  Orders Placed This Encounter  Procedures  . XR KNEE 3 VIEW RIGHT   No orders of the defined types were placed in this encounter.     Procedures: No procedures performed   Clinical Data: No additional findings.   Subjective: Chief Complaint  Patient presents with  . Right Shoulder - Follow-up   HPI Patient presents today for follow up on her right shoulder and bilateral carpal tunnel. She states that her right shoulder has improved some with physical therapy, but has noticed it now pops with movement. She has been going to therapy twice weekly. She had EMG studies with Dr.Newton on 09/07/2020 for her carpal tunnel. She is here today to discuss those results.   In addition she had fallen and landed onto her right knee at that time she had rather significant swelling in the anterior portion of the knee over the patella.  It is now resolved but his she is noted to have a firm soft tissue piece subcutaneously which has a little bit of mobility but is not painful.    Review of Systems  Constitutional: Negative.   HENT: Negative.   Respiratory: Negative.   Cardiovascular: Negative.   Gastrointestinal: Negative.    Genitourinary: Negative.   Skin: Negative.   Neurological: Negative.   Hematological: Negative.   Psychiatric/Behavioral: Negative.      Objective: Vital Signs: Ht 5\' 8"  (1.727 m)   Wt (!) 336 lb (152.4 kg)   BMI 51.09 kg/m   Physical Exam Constitutional:      Appearance: Normal appearance. She is well-developed. She is obese.  HENT:     Head: Normocephalic.  Eyes:     Pupils: Pupils are equal, round, and reactive to light.  Pulmonary:     Effort: Pulmonary effort is normal.  Skin:    General: Skin is warm and dry.  Neurological:     Mental Status: She is alert and oriented to person, place, and time.  Psychiatric:        Behavior: Behavior normal.     Ortho Exam  Exam today of the right shoulder reveals pretty much full range of motion.  Little bit tenderness over the [facility] joint.  I could only feel may be a little bit of a pop in the shoulder with range of motion.  Empty can test is really not essentially negative.  Good strength.  The right knee reveals mobile firm subcutaneous mass measuring probably about 3 to 4 mm in diameter.  Not particularly painful to palpation.  Her  knee exam is otherwise normal.  Little bit of crepitance with range of motion.   Specialty Comments:  No specialty comments available.  Imaging: XR KNEE 3 VIEW RIGHT  Result Date: 09/20/2020 Three-view x-ray of the right knee does not reveal any occult pathology.  There is maintenance of good joint spaces.  Area that was placed with a marker does not show any osseous abnormality.  EMG & NCV Findings: Evaluation of the left median motor nerve showed reduced amplitude (4.8 mV).  The left median (across palm) sensory nerve showed prolonged distal peak latency (Wrist, 3.7 ms).  All remaining nerves (as indicated in the following tables) were within normal limits.    All examined muscles (as indicated in the following table) showed no evidence of electrical instability.    Impression: The above  electrodiagnostic study is ABNORMAL and reveals evidence of a mild left median nerve entrapment at the wrist (carpal tunnel syndrome) affecting sensory components. There is no significant electrodiagnostic evidence of any other focal nerve entrapment, brachial plexopathy or cervical radiculopathy.   Recommendations: 1.  Follow-up with referring physician. 2.  Continue current management of symptoms. 3.  Continue use of resting splint at night-time and as needed during the day.  ___________________________ Wonda Olds Board Certified, American Board of Physical Medicine and Rehabilitation   PMFS History: Current Outpatient Medications  Medication Sig Dispense Refill  . calcium carbonate (OS-CAL) 600 MG TABS Take 600 mg by mouth 2 (two) times daily with a meal.    . cetirizine (ZYRTEC) 10 MG tablet Take 1 tablet (10 mg total) by mouth daily. 100 tablet 4  . citalopram (CELEXA) 40 MG tablet TAKE 1 TABLET BY MOUTH EVERY DAY 90 tablet 0  . fluticasone (FLONASE) 50 MCG/ACT nasal spray Place 2 sprays into both nostrils daily. 48 g 4  . ibuprofen (ADVIL,MOTRIN) 200 MG tablet Take 600-800 mg by mouth every 6 (six) hours as needed for pain.    Marland Kitchen loperamide (IMODIUM) 2 MG capsule Take 2 mg by mouth 2 (two) times daily as needed for diarrhea or loose stools.    . montelukast (SINGULAIR) 10 MG tablet Take 1 tablet (10 mg total) by mouth at bedtime. 100 tablet 4  . Multiple Vitamin (MULTIVITAMIN WITH MINERALS) TABS Take 1 tablet by mouth every morning.    Marland Kitchen NORTREL 7/7/7 0.5/0.75/1-35 MG-MCG tablet TAKE 1 TABLET BY MOUTH EVERY DAY 84 tablet 0  . Vitamin D, Ergocalciferol, (DRISDOL) 1.25 MG (50000 UNIT) CAPS capsule Take 1 capsule (50,000 Units total) by mouth every 7 (seven) days. 12 capsule 0   No current facility-administered medications for this visit.    Patient Active Problem List   Diagnosis Date Noted  . Contusion of right knee 09/20/2020  . Prediabetes 08/14/2020  . Mixed  hyperlipidemia 08/10/2020  . Seasonal allergies 08/10/2020  . Pain in right shoulder 08/07/2020  . Carpal tunnel syndrome, bilateral 08/07/2020  . H/O carpal tunnel repair 11/04/2018  . Routine general medical examination at a health care facility 09/12/2014  . Anxiety 09/17/2012  . Class 3 severe obesity due to excess calories with body mass index (BMI) of 50.0 to 59.9 in adult [facility]) 06/30/2011   Past Medical History:  Diagnosis Date  . Adult acne   . Dysfunctional uterine bleeding   . Obesity     Family History  Problem Relation Age of Onset  . Hyperlipidemia Father   . Heart disease Father   . Obesity Father   . Cancer Maternal Grandfather  Pancreatic  . Hyperlipidemia Maternal Grandfather   . Heart attack Maternal Grandfather   . Hyperlipidemia Other   . Hypertension Other   . Obesity Other   . Stroke Maternal Grandmother   . Breast cancer Neg Hx     Past Surgical History:  Procedure Laterality Date  . CARPAL TUNNEL RELEASE  09/2010   Right  . TONSILLECTOMY AND ADENOIDECTOMY    . TYMPANOSTOMY TUBE PLACEMENT     bilateral   Social History   Occupational History  . Not on file  Tobacco Use  . Smoking status: Never Smoker  . Smokeless tobacco: Never Used  Substance and Sexual Activity  . Alcohol use: Yes    Comment: large glass of wine 3-4 nights/week  . Drug use: No  . Sexual activity: Not on file

## 2020-09-21 ENCOUNTER — Encounter: Payer: 59 | Admitting: Rehabilitative and Restorative Service Providers"

## 2020-09-27 ENCOUNTER — Encounter: Payer: Self-pay | Admitting: Physical Therapy

## 2020-09-27 ENCOUNTER — Ambulatory Visit (INDEPENDENT_AMBULATORY_CARE_PROVIDER_SITE_OTHER): Payer: 59 | Admitting: Physical Therapy

## 2020-09-27 ENCOUNTER — Other Ambulatory Visit: Payer: Self-pay

## 2020-09-27 DIAGNOSIS — M25511 Pain in right shoulder: Secondary | ICD-10-CM

## 2020-09-27 DIAGNOSIS — G8929 Other chronic pain: Secondary | ICD-10-CM | POA: Diagnosis not present

## 2020-09-27 DIAGNOSIS — M6281 Muscle weakness (generalized): Secondary | ICD-10-CM

## 2020-09-27 NOTE — Therapy (Addendum)
[facility] [address] [place], [place], [address] Phone: [phone]   Fax:  [phone]  Physical Therapy Treatment/Recertification  Patient Details  Name: Erika Mullins MRN: [id number] Date of Birth: 04/22/1976 Referring Provider (PT): Whitfield, Peter W, MD   Encounter Date: 09/27/2020   PT End of Session - 09/27/20 0924    Visit Number 6    Number of Visits 12    Date for PT Re-Evaluation 11/08/20    Authorization Type [employer]    PT Start Time 0806    PT Stop Time 0844    PT Time Calculation (min) 38 min    Activity Tolerance Patient tolerated treatment well    Behavior During Therapy WFL for tasks assessed/performed           Past Medical History:  Diagnosis Date  . Adult acne   . Dysfunctional uterine bleeding   . Obesity     Past Surgical History:  Procedure Laterality Date  . CARPAL TUNNEL RELEASE  09/2010   Right  . TONSILLECTOMY AND ADENOIDECTOMY    . TYMPANOSTOMY TUBE PLACEMENT     bilateral    There were no vitals filed for this visit.   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 Yes    Pain Score 5     Pain Location Shoulder    Pain Orientation Right    Pain Descriptors / Indicators Aching    Pain Type Chronic pain    Pain Onset More than a month ago    Pain Frequency Intermittent    Aggravating Factors  repetitive motion    Pain Relieving Factors rest              [facility] PT Assessment - 09/27/20 0816      Observation/Other Assessments   Focus on Therapeutic Outcomes (FOTO)  60      Strength   Right Shoulder Flexion 5/5    Right Shoulder Extension 5/5    Right Shoulder ABduction 4+/5    Right  Shoulder Internal Rotation 5/5    Right Shoulder External Rotation 4/5    Right Shoulder Horizontal ABduction 5/5                         [facility] Adult PT Treatment/Exercise - 09/27/20 0810      Shoulder Exercises: Sidelying   External Rotation Right   3x10   External Rotation Weight (lbs) 2    ABduction Right   3x10   ABduction Weight (lbs) 2      Shoulder Exercises: Standing   Flexion Right   3x10   Shoulder Flexion Weight (lbs) 2    ABduction Right   3x10   Shoulder ABduction Weight (lbs) 2    Extension Strengthening;Both   3x10   Theraband Level (Shoulder Extension) Level 4 (Blue)    Row Strengthening;Both   3x10   Theraband Level (Shoulder Row) Level 4 (Blue)      Shoulder Exercises: Pulleys   Flexion 3 minutes    Scaption 3 minutes                         PT Long Term Goals - 09/27/20 0925      PT LONG TERM GOAL #1   Title Pt will be I and compliant with HEP.    Time 6    Period Weeks    Status On-going    Target Date 11/08/20      PT LONG TERM GOAL #2   Title Pt will improve FOTO functional outcome measure score to 75%    Baseline 12/9: decrease in 5% due to increased use, limited HEP compliance    Status On-going    Target Date 11/08/20      PT LONG TERM GOAL #3   Title Pt will improve Rt shoulder strength to 5/5 MMT to improve function    Baseline 12/9: partially met, will continue goal    Status On-going    Target Date 11/08/20      PT LONG TERM GOAL #4   Title Pt will have less than 3/10 pain with ususal acvity, work, sleeping    Baseline 12/9: up to 5-6/10    Status On-going    Target Date 11/08/20                 Plan - 09/27/20 0926    Clinical Impression Statement Pt with ongoing LTGs at this time, and overall demonstrating improvement in functional mobility and strength, but due to limited HEP compliance and increased playing of musical instruments for upcoming show her pain has increased.  Plan to continue PT  2x/wk x 4-6 more weeks to maximize function and improve strength and pain.    Personal Factors and Comorbidities Comorbidity 1;Past/Current Experience    Examination-Activity Limitations Carry;Lift;Sleep;Reach Overhead    Stability/Clinical Decision Making Stable/Uncomplicated    Rehab Potential Good    PT Frequency 2x / week    PT Duration 6 weeks    PT Treatment/Interventions ADLs/Self Care Home Management;Electrical Stimulation;Cryotherapy;Iontophoresis 4mg/ml Dexamethasone;Moist Heat;Ultrasound;Therapeutic activities;Therapeutic exercise;Neuromuscular re-education;Manual techniques;Passive range of motion;Dry needling;Joint Manipulations;Taping;Vasopneumatic Device    PT Next Visit Plan review and update HEP PRN. needs scapular stability and RTC strength.    PT Home Exercise Plan Access Code: [email or web address]    Consulted and Agree with Plan of Care Patient           Patient will benefit from skilled therapeutic intervention in order to improve the following deficits and impairments:  Decreased activity tolerance,Decreased strength,Increased fascial restricitons,Increased muscle spasms,Impaired flexibility,Pain  Visit Diagnosis: Chronic right shoulder pain - Plan: PT plan of care cert/re-cert  Muscle weakness (generalized) - Plan: PT plan of care cert/re-cert           Problem List Patient Active Problem List   Diagnosis Date Noted  . Contusion of right knee 09/20/2020  . Prediabetes 08/14/2020  . Mixed hyperlipidemia 08/10/2020  . Seasonal allergies 08/10/2020  . Pain in right shoulder 08/07/2020  . Carpal tunnel syndrome, bilateral 08/07/2020  . H/O carpal tunnel repair 11/04/2018  . Routine general medical examination at a health care facility 09/12/2014  . Anxiety 09/17/2012  . Class 3 severe obesity due to excess calories with body mass index (BMI) of 50.0 to 59.9 in adult ([facility]) 06/30/2011      Stephanie F Matthews, PT, DPT 09/27/20 9:28 AM   [facility] [address] [place], [place], [address] Phone: [phone]   Fax:  [phone]  Name: Erika Mullins MRN: [id number] Date of Birth: 08/06/1976  Waverly Martin, PT, MPT 10/02/20 4:35 PM    

## 2020-10-04 ENCOUNTER — Ambulatory Visit: Payer: 59 | Admitting: Orthopaedic Surgery

## 2020-10-04 ENCOUNTER — Other Ambulatory Visit: Payer: Self-pay

## 2020-10-04 ENCOUNTER — Encounter: Payer: Self-pay | Admitting: Orthopaedic Surgery

## 2020-10-04 VITALS — Ht 68.0 in | Wt 336.0 lb

## 2020-10-04 DIAGNOSIS — M25511 Pain in right shoulder: Secondary | ICD-10-CM | POA: Diagnosis not present

## 2020-10-04 NOTE — Progress Notes (Signed)
Office Visit Note   Patient: Erika Mullins           Date of Birth: 06/29/1976           MRN: [id number] Visit Date: 10/04/2020              Requested by: Billie Ruddy, MD [place],   [id number] PCP: Billie Ruddy, MD   Assessment & Plan: Visit Diagnoses:  1. Right shoulder pain, unspecified chronicity     Plan:  #1: Continue her physical therapy as per protocol since this is improving her symptoms. #2: Follow back up prn.   Follow-Up Instructions: Return if symptoms worsen or fail to improve, for follow up.   Orders:  No orders of the defined types were placed in this encounter.  No orders of the defined types were placed in this encounter.     Procedures: No procedures performed   Clinical Data: No additional findings.   Subjective: Chief Complaint  Patient presents with  . Right Shoulder - Follow-up   HPI: Patient presents today for a two week follow up on her right shoulder. She said that she is doing well. She is still going to physical therapy twice weekly.     Review of Systems   Objective: Vital Signs: Ht 5\' 8"  (1.727 m)   Wt (!) 336 lb (152.4 kg)   BMI 51.09 kg/m   Physical Exam  Ortho Exam  Today she has fairly good motion in all planes.  She has good strength also.  She is tender over her AC joint.  She does have pain at the [facility] joint when I put her into a cross chest maneuver.  Otherwise she is markedly improved.  Specialty Comments:  No specialty comments available.  Imaging: No results found.   PMFS History: Patient Active Problem List   Diagnosis Date Noted  . Contusion of right knee 09/20/2020  . Prediabetes 08/14/2020  . Mixed hyperlipidemia 08/10/2020  . Seasonal allergies 08/10/2020  . Pain in right shoulder 08/07/2020  . Carpal tunnel syndrome, bilateral 08/07/2020  . H/O carpal tunnel repair 11/04/2018  . Routine general medical examination at a health care facility 09/12/2014  .  Anxiety 09/17/2012  . Class 3 severe obesity due to excess calories with body mass index (BMI) of 50.0 to 59.9 in adult [facility]) 06/30/2011   Past Medical History:  Diagnosis Date  . Adult acne   . Dysfunctional uterine bleeding   . Obesity     Family History  Problem Relation Age of Onset  . Hyperlipidemia Father   . Heart disease Father   . Obesity Father   . Cancer Maternal Grandfather        Pancreatic  . Hyperlipidemia Maternal Grandfather   . Heart attack Maternal Grandfather   . Hyperlipidemia Other   . Hypertension Other   . Obesity Other   . Stroke Maternal Grandmother   . Breast cancer Neg Hx     Past Surgical History:  Procedure Laterality Date  . CARPAL TUNNEL RELEASE  09/2010   Right  . TONSILLECTOMY AND ADENOIDECTOMY    . TYMPANOSTOMY TUBE PLACEMENT     bilateral   Social History   Occupational History  . Not on file  Tobacco Use  . Smoking status: Never Smoker  . Smokeless tobacco: Never Used  Substance and Sexual Activity  . Alcohol use: Yes    Comment: large glass of wine 3-4 nights/week  . Drug use:  No  . Sexual activity: Not on file

## 2020-10-10 ENCOUNTER — Other Ambulatory Visit: Payer: Self-pay | Admitting: Family Medicine

## 2020-10-16 ENCOUNTER — Other Ambulatory Visit: Payer: Self-pay

## 2020-10-16 ENCOUNTER — Ambulatory Visit: Payer: 59 | Admitting: Physical Therapy

## 2020-10-16 DIAGNOSIS — M25511 Pain in right shoulder: Secondary | ICD-10-CM | POA: Diagnosis not present

## 2020-10-16 DIAGNOSIS — M6281 Muscle weakness (generalized): Secondary | ICD-10-CM

## 2020-10-16 DIAGNOSIS — G8929 Other chronic pain: Secondary | ICD-10-CM | POA: Diagnosis not present

## 2020-10-16 NOTE — Therapy (Signed)
[facility] Therapy [address] [place], [place], [address] Phone: [phone]   Fax:  [phone]  Physical Therapy Treatment  Patient Details  Name: Erika Mullins MRN: [id number] Date of Birth: 1976/01/05 Referring Provider (PT): Garald Balding, MD   Encounter Date: 10/16/2020   PT End of Session - 10/16/20 1003    Visit Number 7    Number of Visits 12    Date for PT Re-Evaluation 11/08/20    Authorization Type UHC    PT Start Time 0930    PT Stop Time 1008    PT Time Calculation (min) 38 min    Activity Tolerance Patient tolerated treatment well    Behavior During Therapy [facility] for tasks assessed/performed           Past Medical History:  Diagnosis Date  . Adult acne   . Dysfunctional uterine bleeding   . Obesity     Past Surgical History:  Procedure Laterality Date  . CARPAL TUNNEL RELEASE  09/2010   Right  . TONSILLECTOMY AND ADENOIDECTOMY    . TYMPANOSTOMY TUBE PLACEMENT     bilateral    There were no vitals filed for this visit.   Subjective Assessment - 10/16/20 0931    Subjective shoulder is doing well, has calmed down following playing multiple shows.    Pertinent History PMH: CTS with release    Limitations House hold activities;Reading    Diagnostic tests negative XR Rt shoulder 08/07/20    Patient Stated Goals reduce pain    Currently in Pain? No/denies                             [facility] Adult PT Treatment/Exercise - 10/16/20 0933      Shoulder Exercises: Standing   External Rotation Right;Theraband   3x10   Theraband Level (Shoulder External Rotation) Level 4 (Blue)    Internal Rotation Right;Theraband   3x10   Theraband Level (Shoulder Internal Rotation) Level 4 (Blue)    Flexion Right   3x10   Shoulder Flexion Weight (lbs) 2    ABduction Right   3x10   Shoulder ABduction Weight (lbs) 2    Corporate treasurer;Both   3x10   Theraband Level (Shoulder Row) Other (comment)   L5      Shoulder Exercises: Pulleys   Flexion 3 minutes    Scaption 3 minutes      Shoulder Exercises: ROM/Strengthening   UBE (Upper Arm Bike) L4 x 6 min (3' each direction)    Wall Pushups 20 reps                       PT Long Term Goals - 09/27/20 0925      PT LONG TERM GOAL #1   Title Pt will be I and compliant with HEP.    Time 6    Period Weeks    Status On-going    Target Date 11/08/20      PT LONG TERM GOAL #2   Title Pt will improve FOTO functional outcome measure score to 75%    Baseline 12/9: decrease in 5% due to increased use, limited HEP compliance    Status On-going    Target Date 11/08/20      PT LONG TERM GOAL #3   Title Pt will improve Rt shoulder strength to 5/5 MMT to improve function    Baseline 12/9: partially met, will continue goal  Status On-going    Target Date 11/08/20      PT LONG TERM GOAL #4   Title Pt will have less than 3/10 pain with ususal acvity, work, sleeping    Baseline 12/9: up to 5-6/10    Status On-going    Target Date 11/08/20                 Plan - 10/16/20 1011    Clinical Impression Statement Pt doing much better following break from multiple shows.  She returns to work tomorrow so will see how this impacts her shoulder.  May be ready for d/c early if pain continues to be minimial/eliminated.    Personal Factors and Comorbidities Comorbidity 1;Past/Current Experience    Examination-Activity Limitations Carry;Lift;Sleep;Reach Overhead    Stability/Clinical Decision Making Stable/Uncomplicated    Rehab Potential Good    PT Frequency 2x / week    PT Duration 6 weeks    PT Treatment/Interventions ADLs/Self Care Home Management;Electrical Stimulation;Cryotherapy;Iontophoresis 71m/ml Dexamethasone;Moist Heat;Ultrasound;Therapeutic activities;Therapeutic exercise;Neuromuscular re-education;Manual techniques;Passive range of motion;Dry needling;Joint Manipulations;Taping;Vasopneumatic Device    PT Next Visit Plan work  on strengthening, consider working towards d/c if doing well    PT Home Exercise Plan Access Code: [email or web address]   Consulted and Agree with Plan of Care Patient           Patient will benefit from skilled therapeutic intervention in order to improve the following deficits and impairments:  Decreased activity tolerance,Decreased strength,Increased fascial restricitons,Increased muscle spasms,Impaired flexibility,Pain  Visit Diagnosis: Chronic right shoulder pain  Muscle weakness (generalized)     Problem List Patient Active Problem List   Diagnosis Date Noted  . Contusion of right knee 09/20/2020  . Prediabetes 08/14/2020  . Mixed hyperlipidemia 08/10/2020  . Seasonal allergies 08/10/2020  . Pain in right shoulder 08/07/2020  . Carpal tunnel syndrome, bilateral 08/07/2020  . H/O carpal tunnel repair 11/04/2018  . Routine general medical examination at a health care facility 09/12/2014  . Anxiety 09/17/2012  . Class 3 severe obesity due to excess calories with body mass index (BMI) of 50.0 to 59.9 in adult ([facility] 06/30/2011      Erika Mullins PT, DPT 10/16/20 10:12 AM    [facility] [address] [address]Phone: [phone]  Fax:  [phone] Name: Erika ROMANEKMRN: [id number] of Birth: [birth year]

## 2020-10-17 ENCOUNTER — Encounter: Payer: 59 | Admitting: Physical Therapy

## 2020-10-18 ENCOUNTER — Encounter: Payer: 59 | Admitting: Physical Therapy

## 2020-10-20 ENCOUNTER — Other Ambulatory Visit: Payer: Self-pay | Admitting: Family Medicine

## 2020-10-20 DIAGNOSIS — F419 Anxiety disorder, unspecified: Secondary | ICD-10-CM

## 2020-10-22 ENCOUNTER — Ambulatory Visit (INDEPENDENT_AMBULATORY_CARE_PROVIDER_SITE_OTHER): Payer: 59 | Admitting: Physical Therapy

## 2020-10-22 ENCOUNTER — Encounter: Payer: Self-pay | Admitting: Physical Therapy

## 2020-10-22 ENCOUNTER — Other Ambulatory Visit: Payer: Self-pay

## 2020-10-22 DIAGNOSIS — G8929 Other chronic pain: Secondary | ICD-10-CM

## 2020-10-22 DIAGNOSIS — M25511 Pain in right shoulder: Secondary | ICD-10-CM | POA: Diagnosis not present

## 2020-10-22 DIAGNOSIS — M6281 Muscle weakness (generalized): Secondary | ICD-10-CM

## 2020-10-22 NOTE — Telephone Encounter (Signed)
Pt LOV was on 10/22 2021 and last refill was done on 07/09/2020 for 90 tablets, please advise if ok to send refill

## 2020-10-22 NOTE — Therapy (Addendum)
[facility] Therapy [address] [place], [place], [address] Phone: [phone]   Fax:  [phone]  Physical Therapy Treatment/Discharge Summary  Patient Details  Name: Erika Mullins MRN: [id number] Date of Birth: 02-26-1976 Referring Provider (PT): Garald Balding, MD   Encounter Date: 10/22/2020   PT End of Session - 10/22/20 0831    Visit Number 8    Number of Visits 12    Date for PT Re-Evaluation 11/08/20    Authorization Type UHC    PT Start Time 0805    PT Stop Time 0831    PT Time Calculation (min) 26 min    Activity Tolerance Patient tolerated treatment well    Behavior During Therapy [facility] for tasks assessed/performed           Past Medical History:  Diagnosis Date  . Adult acne   . Dysfunctional uterine bleeding   . Obesity     Past Surgical History:  Procedure Laterality Date  . CARPAL TUNNEL RELEASE  09/2010   Right  . TONSILLECTOMY AND ADENOIDECTOMY    . TYMPANOSTOMY TUBE PLACEMENT     bilateral    There were no vitals filed for this visit.   Subjective Assessment - 10/22/20 0807    Subjective shoulder still doing well - no issues    Pertinent History PMH: CTS with release    Limitations House hold activities;Reading    Diagnostic tests negative XR Rt shoulder 08/07/20    Patient Stated Goals reduce pain    Currently in Pain? No/denies              [facility] PT Assessment - 10/22/20 0828      Assessment   Medical Diagnosis right shoulder impingement    Referring Provider (PT) Garald Balding, MD      Observation/Other Assessments   Focus on Therapeutic Outcomes (FOTO)  79      Strength   Right Shoulder Flexion 5/5    Right Shoulder Extension 5/5    Right Shoulder ABduction 5/5    Right Shoulder Internal Rotation 5/5    Right Shoulder External Rotation 5/5    Right Shoulder Horizontal ABduction 5/5                         [facility] Adult PT Treatment/Exercise - 10/22/20 0808       Shoulder Exercises: Standing   External Rotation Right;Theraband   3x10   Theraband Level (Shoulder External Rotation) Level 4 (Blue)    Internal Rotation Right;Theraband   3x10   Theraband Level (Shoulder Internal Rotation) Level 4 (Blue)    Flexion Right   3x10   Shoulder Flexion Weight (lbs) 3    ABduction Right   3x10   Shoulder ABduction Weight (lbs) 3    Other Standing Exercises bicep curls with shoulder press 3# 3x10 bil      Shoulder Exercises: ROM/Strengthening   UBE (Upper Arm Bike) L5 x 6 min (3' each direction)                       PT Long Term Goals - 10/22/20 9528      PT LONG TERM GOAL #1   Title Pt will be I and compliant with HEP.    Time 6    Period Weeks    Status Achieved      PT LONG TERM GOAL #2   Title Pt will improve FOTO functional outcome measure  score to 75%    Status Achieved      PT LONG TERM GOAL #3   Title Pt will improve Rt shoulder strength to 5/5 MMT to improve function    Status Achieved      PT LONG TERM GOAL #4   Title Pt will have less than 3/10 pain with ususal acvity, work, sleeping    Status Achieved                 Plan - 10/22/20 8242    Clinical Impression Statement Pt has met all goals and is ready to hold PT.  Will hold PT x 30 days and pt to call if she needs to return.    Personal Factors and Comorbidities Comorbidity 1;Past/Current Experience    Examination-Activity Limitations Carry;Lift;Sleep;Reach Overhead    Stability/Clinical Decision Making Stable/Uncomplicated    Rehab Potential Good    PT Frequency 2x / week    PT Duration 6 weeks    PT Treatment/Interventions ADLs/Self Care Home Management;Electrical Stimulation;Cryotherapy;Iontophoresis 5m/ml Dexamethasone;Moist Heat;Ultrasound;Therapeutic activities;Therapeutic exercise;Neuromuscular re-education;Manual techniques;Passive range of motion;Dry needling;Joint Manipulations;Taping;Vasopneumatic Device    PT Next Visit Plan hold PT    PT Home  Exercise Plan Access Code: [email or web address]   Consulted and Agree with Plan of Care Patient           Patient will benefit from skilled therapeutic intervention in order to improve the following deficits and impairments:  Decreased activity tolerance,Decreased strength,Increased fascial restricitons,Increased muscle spasms,Impaired flexibility,Pain  Visit Diagnosis: Chronic right shoulder pain  Muscle weakness (generalized)     Problem List Patient Active Problem List   Diagnosis Date Noted  . Contusion of right knee 09/20/2020  . Prediabetes 08/14/2020  . Mixed hyperlipidemia 08/10/2020  . Seasonal allergies 08/10/2020  . Pain in right shoulder 08/07/2020  . Carpal tunnel syndrome, bilateral 08/07/2020  . H/O carpal tunnel repair 11/04/2018  . Routine general medical examination at a health care facility 09/12/2014  . Anxiety 09/17/2012  . Class 3 severe obesity due to excess calories with body mass index (BMI) of 50.0 to 59.9 in adult ([facility] 06/30/2011      SLaureen Abrahams PT, DPT 10/22/20 8:33 AM    [facility] [address] [place] [address]Phone: [phone]  Fax:  [phone] Name: Erika TACKETTMRN: [id number] of Birth: 101/11/1975    PHYSICAL THERAPY DISCHARGE SUMMARY  Visits from Start of Care: 8  Current functional level related to goals / functional outcomes: See above   Remaining deficits: See above   Education / Equipment: HEP  Plan: Patient agrees to discharge.  Patient goals were met. Patient is being discharged due to meeting the stated rehab goals.  ?????     SLaureen Abrahams PT, DPT 12/05/20 12:51 PM  [facility] [address] [place] [address]Phone: [phone]  Fax:  [phone]

## 2020-10-24 ENCOUNTER — Encounter: Payer: 59 | Admitting: Physical Therapy

## 2020-10-26 NOTE — Telephone Encounter (Signed)
pt need an refill  pt citalopram (CELEXA) 40 MG tablet [[id number]] [id number] [place] [address], [place]  [address], [place] [address]  Phone:  [phone] Fax:  [phone]

## 2020-10-29 ENCOUNTER — Encounter: Payer: 59 | Admitting: Physical Therapy

## 2020-10-31 ENCOUNTER — Encounter: Payer: 59 | Admitting: Physical Therapy

## 2020-12-31 ENCOUNTER — Other Ambulatory Visit: Payer: Self-pay | Admitting: Family Medicine

## 2021-03-05 ENCOUNTER — Other Ambulatory Visit: Payer: Self-pay

## 2021-03-05 ENCOUNTER — Emergency Department (HOSPITAL_BASED_OUTPATIENT_CLINIC_OR_DEPARTMENT_OTHER)
Admission: EM | Admit: 2021-03-05 | Discharge: 2021-03-05 | Disposition: A | Discharge status: Home / Self Care | Payer: 59 | Attending: Emergency Medicine | Admitting: Emergency Medicine

## 2021-03-05 ENCOUNTER — Encounter (HOSPITAL_BASED_OUTPATIENT_CLINIC_OR_DEPARTMENT_OTHER): Payer: Self-pay | Admitting: Emergency Medicine

## 2021-03-05 DIAGNOSIS — K625 Hemorrhage of anus and rectum: Secondary | ICD-10-CM | POA: Diagnosis not present

## 2021-03-05 DIAGNOSIS — U071 COVID-19: Secondary | ICD-10-CM | POA: Diagnosis not present

## 2021-03-05 DIAGNOSIS — J029 Acute pharyngitis, unspecified: Secondary | ICD-10-CM | POA: Diagnosis present

## 2021-03-05 DIAGNOSIS — R197 Diarrhea, unspecified: Secondary | ICD-10-CM

## 2021-03-05 LAB — CBC WITH DIFFERENTIAL/PLATELET
Abs Immature Granulocytes: 0.04 10*3/uL (ref 0.00–0.07)
Basophils Absolute: 0.1 10*3/uL (ref 0.0–0.1)
Basophils Relative: 1 %
Eosinophils Absolute: 0.2 10*3/uL (ref 0.0–0.5)
Eosinophils Relative: 2 %
HCT: 43.6 % (ref 36.0–46.0)
Hemoglobin: 14.2 g/dL (ref 12.0–15.0)
Immature Granulocytes: 0 %
Lymphocytes Relative: 31 %
Lymphs Abs: 3.1 10*3/uL (ref 0.7–4.0)
MCH: 27.9 pg (ref 26.0–34.0)
MCHC: 32.6 g/dL (ref 30.0–36.0)
MCV: 85.7 fL (ref 80.0–100.0)
Monocytes Absolute: 0.8 10*3/uL (ref 0.1–1.0)
Monocytes Relative: 8 %
Neutro Abs: 5.9 10*3/uL (ref 1.7–7.7)
Neutrophils Relative %: 58 %
Platelets: 511 10*3/uL — ABNORMAL HIGH (ref 150–400)
RBC: 5.09 MIL/uL (ref 3.87–5.11)
RDW: 12.3 % (ref 11.5–15.5)
WBC: 10 10*3/uL (ref 4.0–10.5)
nRBC: 0 % (ref 0.0–0.2)

## 2021-03-05 LAB — BASIC METABOLIC PANEL
Anion gap: 11 (ref 5–15)
BUN: 6 mg/dL (ref 6–20)
CO2: 26 mmol/L (ref 22–32)
Calcium: 8.9 mg/dL (ref 8.9–10.3)
Chloride: 100 mmol/L (ref 98–111)
Creatinine, Ser: 0.69 mg/dL (ref 0.44–1.00)
GFR, Estimated: >60 mL/min (ref >60)
Glucose, Bld: 130 mg/dL — ABNORMAL HIGH (ref 70–99)
Potassium: 3.4 mmol/L — ABNORMAL LOW (ref 3.5–5.1)
Sodium: 137 mmol/L (ref 135–145)

## 2021-03-05 LAB — OCCULT BLOOD X 1 CARD TO LAB, STOOL: Fecal Occult Bld: POSITIVE — AB

## 2021-03-05 NOTE — ED Provider Notes (Signed)
[facility] EMERGENCY DEPT Provider Note   CSN: [id number] Arrival date & time: 03/05/21  1128     History Chief Complaint  Patient presents with  . Rectal Bleeding    Erika Mullins is a 45 y.o. female.  HPI Patient reports that she got diagnosed with COVID on Saturday.  That was 3 days ago.  She reports she feels fine.  She reports she felt like she had a head cold.  Some congestion and minor sore throat.  No shortness of breath.  Minimal cough.  No chest pain.  Patient reports 2 days later she developed a little bit of vomiting and diarrhea.  She reports she had about 3 episodes of diarrhea yesterday.  After 3 episodes she was seeing some blood clots.  She reports today she had 1 more episode of loose stool with some blood clots.  She is not having any rectal pain or abdominal pain.  No lightheadedness dizziness or near syncope.  No history of GI bleed.  Reports she has been vaccinated and boosted for COVID.    Past Medical History:  Diagnosis Date  . Adult acne   . Dysfunctional uterine bleeding   . Obesity     Patient Active Problem List   Diagnosis Date Noted  . Contusion of right Mullins 09/20/2020  . Prediabetes 08/14/2020  . Mixed hyperlipidemia 08/10/2020  . Seasonal allergies 08/10/2020  . Pain in right shoulder 08/07/2020  . Carpal tunnel syndrome, bilateral 08/07/2020  . H/O carpal tunnel repair 11/04/2018  . Routine general medical examination at a health care facility 09/12/2014  . Anxiety 09/17/2012  . Class 3 severe obesity due to excess calories with body mass index (BMI) of 50.0 to 59.9 in adult [facility]) 06/30/2011    Past Surgical History:  Procedure Laterality Date  . CARPAL TUNNEL RELEASE  09/2010   Right  . TONSILLECTOMY AND ADENOIDECTOMY    . TYMPANOSTOMY TUBE PLACEMENT     bilateral     OB History   No obstetric history on file.     Family History  Problem Relation Age of Onset  . Hyperlipidemia Father   . Heart disease  Father   . Obesity Father   . Cancer Maternal Grandfather        Pancreatic  . Hyperlipidemia Maternal Grandfather   . Heart attack Maternal Grandfather   . Hyperlipidemia Other   . Hypertension Other   . Obesity Other   . Stroke Maternal Grandmother   . Breast cancer Neg Hx     Social History   Tobacco Use  . Smoking status: Never Smoker  . Smokeless tobacco: Never Used  Substance Use Topics  . Alcohol use: Yes    Comment: large glass of wine 3-4 nights/week  . Drug use: No    Home Medications Prior to Admission medications   Medication Sig Start Date End Date Taking? Authorizing Provider  calcium carbonate (OS-CAL) 600 MG TABS Take 600 mg by mouth 2 (two) times daily with a meal.    [provider]  cetirizine (ZYRTEC) 10 MG tablet Take 1 tablet (10 mg total) by mouth daily. 09/09/16   Dorena Cookey, MD  citalopram (CELEXA) 40 MG tablet TAKE 1 TABLET BY MOUTH EVERY DAY 10/26/20   Billie Ruddy, MD  fluticasone (FLONASE) 50 MCG/ACT nasal spray Place 2 sprays into both nostrils daily. 06/24/19   Billie Ruddy, MD  ibuprofen (ADVIL,MOTRIN) 200 MG tablet Take 600-800 mg by mouth every 6 (six)  hours as needed for pain.    [provider]  loperamide (IMODIUM) 2 MG capsule Take 2 mg by mouth 2 (two) times daily as needed for diarrhea or loose stools.    [provider]  montelukast (SINGULAIR) 10 MG tablet Take 1 tablet (10 mg total) by mouth at bedtime. 09/09/16   Dorena Cookey, MD  Multiple Vitamin (MULTIVITAMIN WITH MINERALS) TABS Take 1 tablet by mouth every morning.    [provider]  NORTREL 7/7/7 0.5/0.75/1-35 MG-MCG tablet TAKE 1 TABLET BY MOUTH EVERY DAY 12/31/20   Billie Ruddy, MD  Vitamin D, Ergocalciferol, (DRISDOL) 1.25 MG (50000 UNIT) CAPS capsule Take 1 capsule (50,000 Units total) by mouth every 7 (seven) days. 08/14/20   Billie Ruddy, MD    Allergies    Doxycycline and Nickel  Review of Systems   Review of  Systems 10 systems reviewed and negative except as per HPI Physical Exam Updated Vital Signs BP (!) 144/94   Pulse 81   Temp 98.2 F (36.8 C) (Oral)   Resp 16   Ht 5\' 9"  (1.753 m)   Wt 136.1 kg   LMP 02/26/2021   SpO2 99%   BMI 44.30 kg/m   Physical Exam Constitutional:      Appearance: She is well-developed.  HENT:     Head: Normocephalic and atraumatic.  Eyes:     Extraocular Movements: Extraocular movements intact.     Conjunctiva/sclera: Conjunctivae normal.  Cardiovascular:     Rate and Rhythm: Normal rate and regular rhythm.     Heart sounds: Normal heart sounds.  Pulmonary:     Effort: Pulmonary effort is normal.     Breath sounds: Normal breath sounds.  Abdominal:     General: Bowel sounds are normal. There is no distension.     Palpations: Abdomen is soft.     Tenderness: There is no abdominal tenderness.  Genitourinary:    Comments: Normal perianal exam.  Digital exam no stool in the vault.  No visible blood. Musculoskeletal:        General: Normal range of motion.     Cervical back: Neck supple.  Skin:    General: Skin is warm and dry.  Neurological:     Mental Status: She is alert and oriented to person, place, and time.     GCS: GCS eye subscore is 4. GCS verbal subscore is 5. GCS motor subscore is 6.     Coordination: Coordination normal.     ED Results / Procedures / Treatments   Labs (all labs ordered are listed, but only abnormal results are displayed) Labs Reviewed  BASIC METABOLIC PANEL - Abnormal; Notable for the following components:      Result Value   Potassium 3.4 (*)    Glucose, Bld 130 (*)    All other components within normal limits  CBC WITH DIFFERENTIAL/PLATELET - Abnormal; Notable for the following components:   Platelets 511 (*)    All other components within normal limits  OCCULT BLOOD X 1 CARD TO LAB, STOOL    EKG None  Radiology No results found.  Procedures Procedures   Medications Ordered in ED Medications -  No data to display  ED Course  I have reviewed the triage vital signs and the nursing notes.  Pertinent labs & imaging results that were available during my care of the patient were reviewed by me and considered in my medical decision making (see chart for details).    MDM  Rules/Calculators/A&P                          Patient reports that she has recently been diagnosed with COVID.  She reports she is not having any difficulty breathing or chest pain.  She has had several episodes of diarrhea and has had several episodes of bloody diarrhea with clot present.  No lightheadedness or dizziness.  No abdominal pain.  No fevers.  At this time patient is clinically well in appearance without respiratory distress.  Abdomen is soft nontender.  No visible blood on rectal exam.  Hemoglobin is stable.  At this time plan will be for continued hydration and symptomatic treatment for COVID and recheck with PCP.  I anticipate bleeding will resolve when diarrhea is resolved. Final Clinical Impression(s) / ED Diagnoses Final diagnoses:  Rectal bleeding  COVID  Diarrhea of presumed infectious origin    Rx / DC Orders ED Discharge Orders    None       Charlesetta Shanks, MD 03/05/21 1428

## 2021-03-05 NOTE — ED Triage Notes (Signed)
Passed a blot clot x 2 rectally yesterday and today , diarrhea 2 days ago. denies vaginal bleeding . No abd pain today or yesterday.

## 2021-03-25 ENCOUNTER — Other Ambulatory Visit: Payer: Self-pay | Admitting: Family Medicine

## 2021-03-25 DIAGNOSIS — Z1231 Encounter for screening mammogram for malignant neoplasm of breast: Secondary | ICD-10-CM

## 2021-03-27 ENCOUNTER — Other Ambulatory Visit: Payer: Self-pay | Admitting: Family Medicine

## 2021-04-04 ENCOUNTER — Ambulatory Visit: Payer: 59

## 2021-04-26 ENCOUNTER — Other Ambulatory Visit: Payer: Self-pay | Admitting: Family Medicine

## 2021-04-26 DIAGNOSIS — Z1231 Encounter for screening mammogram for malignant neoplasm of breast: Secondary | ICD-10-CM

## 2021-05-25 ENCOUNTER — Other Ambulatory Visit: Payer: Self-pay | Admitting: Family Medicine

## 2021-05-25 DIAGNOSIS — F419 Anxiety disorder, unspecified: Secondary | ICD-10-CM

## 2021-06-21 ENCOUNTER — Other Ambulatory Visit: Payer: Self-pay | Admitting: Family Medicine

## 2021-06-26 ENCOUNTER — Other Ambulatory Visit: Payer: Self-pay

## 2021-06-26 ENCOUNTER — Ambulatory Visit
Admission: RE | Admit: 2021-06-26 | Discharge: 2021-06-26 | Disposition: A | Discharge status: Home / Self Care | Payer: 59 | Source: Ambulatory Visit

## 2021-06-26 ENCOUNTER — Other Ambulatory Visit: Payer: Self-pay | Admitting: Family Medicine

## 2021-06-26 DIAGNOSIS — F419 Anxiety disorder, unspecified: Secondary | ICD-10-CM

## 2021-06-26 DIAGNOSIS — Z1231 Encounter for screening mammogram for malignant neoplasm of breast: Secondary | ICD-10-CM

## 2021-07-31 ENCOUNTER — Ambulatory Visit: Payer: 59 | Admitting: Orthopaedic Surgery

## 2021-07-31 ENCOUNTER — Ambulatory Visit: Payer: Self-pay

## 2021-07-31 ENCOUNTER — Encounter: Payer: Self-pay | Admitting: Orthopaedic Surgery

## 2021-07-31 ENCOUNTER — Other Ambulatory Visit: Payer: Self-pay

## 2021-07-31 DIAGNOSIS — G8929 Other chronic pain: Secondary | ICD-10-CM

## 2021-07-31 DIAGNOSIS — M79672 Pain in left foot: Secondary | ICD-10-CM

## 2021-07-31 NOTE — Progress Notes (Signed)
Office Visit Note   Patient: Erika Mullins           Date of Birth: [birth year]           MRN: [id number] Visit Date: 07/31/2021              Requested by: Billie Ruddy, MD [place],  [place] [address] PCP: Billie Ruddy, MD   Assessment & Plan: Visit Diagnoses:  1. Heel pain, chronic, left     Plan: Findings consistent with Achilles contracture causing plantar fasciitis.  Patient has had this in the past. She found physical therapy quite helpful and she would like to try this again. We also discussed wearing a supportive stiff sneaker with a small heel lift. She may use topical antiinflammatories as needed. Zshe will follow up in 1 month  Follow-Up Instructions: No follow-ups on file.   Orders:  Orders Placed This Encounter  Procedures   XR Foot 2 Views Left   No orders of the defined types were placed in this encounter.     Procedures: No procedures performed   Clinical Data: No additional findings.   Subjective: Chief Complaint  Patient presents with   Left Heel - Pain  Patient presents today for left heel pain. She said that it has bothered her for a long time, but has worsened in the last month. Her pain is along the medial side of her heel and on the bottom. No pain in her arch area. She said that her pain is worse upon weightbearing, but has a dull ache with rest. She is not taking anything for pain.  She finds the first few steps in the morning are quite painful and this gets better but then has return of pain with progressive weightbearing.  She has a burning on the medial side of the heel.  She states she has had a bone spur and plantar fasciitis in the past but does not remember which side    Review of Systems   Objective: Vital Signs: There were no vitals taken for this visit.  Physical Exam Constitutional:      Appearance: Normal appearance.  Pulmonary:     Effort: Pulmonary effort is normal.    Ortho  Exam Examination of her left heel she is wearing unsupportive flat shoes today.  She has no erythema no swelling in her foot easily palpable dorsalis pedis pulse.  She has good strength with dorsiflexion plantarflexion inversion and eversion.  She has tenderness over the medial side of the heel.  She is just shy of dorsiflexion to neutral on the left whereas on the unaffected side she can dorsiflex to 10 degrees past neutral.  No tenderness with manipulation of the midfoot Specialty Comments:  No specialty comments available.  Imaging: No results found.   PMFS History: Patient Active Problem List   Diagnosis Date Noted   Contusion of right knee 09/20/2020   Prediabetes 08/14/2020   Mixed hyperlipidemia 08/10/2020   Seasonal allergies 08/10/2020   Pain in right shoulder 08/07/2020   Carpal tunnel syndrome, bilateral 08/07/2020   H/O carpal tunnel repair 11/04/2018   Routine general medical examination at a health care facility 09/12/2014   Anxiety 09/17/2012   Class 3 severe obesity due to excess calories with body mass index (BMI) of 50.0 to 59.9 in adult [facility]) 06/30/2011   Past Medical History:  Diagnosis Date   Adult acne    Dysfunctional uterine bleeding    Obesity  Family History  Problem Relation Age of Onset   Hyperlipidemia Father    Heart disease Father    Obesity Father    Cancer Maternal Grandfather        Pancreatic   Hyperlipidemia Maternal Grandfather    Heart attack Maternal Grandfather    Hyperlipidemia Other    Hypertension Other    Obesity Other    Stroke Maternal Grandmother    Breast cancer Neg Hx     Past Surgical History:  Procedure Laterality Date   CARPAL TUNNEL RELEASE  09/2010   Right   TONSILLECTOMY AND ADENOIDECTOMY     TYMPANOSTOMY TUBE PLACEMENT     bilateral   Social History   Occupational History   Not on file  Tobacco Use   Smoking status: Never   Smokeless tobacco: Never  Substance and Sexual Activity   Alcohol use: Yes     Comment: large glass of wine 3-4 nights/week   Drug use: No   Sexual activity: Not on file

## 2021-08-06 ENCOUNTER — Other Ambulatory Visit: Payer: Self-pay | Admitting: Family Medicine

## 2021-08-06 DIAGNOSIS — F419 Anxiety disorder, unspecified: Secondary | ICD-10-CM

## 2021-08-08 ENCOUNTER — Ambulatory Visit: Payer: 59 | Admitting: Physical Therapy

## 2021-08-12 ENCOUNTER — Other Ambulatory Visit: Payer: Self-pay

## 2021-08-12 ENCOUNTER — Encounter: Payer: Self-pay | Admitting: Physical Therapy

## 2021-08-12 ENCOUNTER — Ambulatory Visit: Payer: 59 | Admitting: Physical Therapy

## 2021-08-12 DIAGNOSIS — R2681 Unsteadiness on feet: Secondary | ICD-10-CM

## 2021-08-12 DIAGNOSIS — R2689 Other abnormalities of gait and mobility: Secondary | ICD-10-CM

## 2021-08-12 DIAGNOSIS — M25572 Pain in left ankle and joints of left foot: Secondary | ICD-10-CM | POA: Diagnosis not present

## 2021-08-12 DIAGNOSIS — M6281 Muscle weakness (generalized): Secondary | ICD-10-CM | POA: Diagnosis not present

## 2021-08-12 DIAGNOSIS — M25672 Stiffness of left ankle, not elsewhere classified: Secondary | ICD-10-CM | POA: Diagnosis not present

## 2021-08-12 NOTE — Patient Instructions (Signed)
Access Code: [email or web address] URL: [email or web address] Date: 08/12/2021 Prepared by: Jamey Reas  Exercises Standing Bilateral Gastroc Stretch with Step - 1 x daily - 7 x weekly - 1 sets - 2-3 reps - 30 seconds hold Standing Soleus Stretch on Step with Counter Support - 1 x daily - 7 x weekly - 1 sets - 2-3 reps - 30 seconds hold Standing Bilateral Heel Raise on Step - 1 x daily - 7 x weekly - 3 sets - 10 reps - 5 seconds hold Standing Eccentric Heel Raise - 1 x daily - 7 x weekly - 3 sets - 10 reps - 5 seconds hold Standing Single Leg Ball Rolls - Forward Backward - 1 x daily - 7 x weekly - 2 sets - 15 reps Standing Single Leg Ball Rolls - Side to Side - 1 x daily - 7 x weekly - 2 sets - 15 reps Standing Single Leg Ball Rolling in Circles - 1 x daily - 7 x weekly - 2 sets - 15 reps

## 2021-08-12 NOTE — Therapy (Signed)
[facility] Physical Therapy [address] [place], [place], [address] Phone: [phone]   Fax:  [phone]  Physical Therapy Evaluation  Patient Details  Name: Erika Mullins MRN: [id number] Date of Birth: 11-05-1975 Referring Provider (PT): Bevely Palmer Persons, [place]   Encounter Date: 08/12/2021   PT End of Session - 08/12/21 0907     Visit Number 1    Number of Visits 8    Date for PT Re-Evaluation 09/06/21    Authorization Type UHC    Authorization Time Period $25 copay, 60 visits, 1 visit prior to PT eval    Authorization - Visit Number 2    Authorization - Number of Visits 60    PT Start Time 0805    PT Stop Time 0851    PT Time Calculation (min) 46 min    Activity Tolerance Patient tolerated treatment well;Patient limited by pain    Behavior During Therapy [facility] for tasks assessed/performed             Past Medical History:  Diagnosis Date   Adult acne    Dysfunctional uterine bleeding    Obesity     Past Surgical History:  Procedure Laterality Date   CARPAL TUNNEL RELEASE  09/2010   Right   TONSILLECTOMY AND ADENOIDECTOMY     TYMPANOSTOMY TUBE PLACEMENT     bilateral    There were no vitals filed for this visit.    Subjective Assessment - 08/12/21 0808     Subjective This 45yo female was referred to PT by Bevely Palmer Persons, PA with [phone] (ICD-10-CM) - Heel pain, chronic, left and Achilles contracture Left with Plantar fascitis. Her pain has increased over last month.    Pertinent History CTS with release, obesity    Patient Stated Goals To be pain free with activities    Currently in Pain? Yes    Pain Score 5    In last week, lowest 0/10 and highest 7/10   Pain Location Foot    Pain Orientation Left;Medial   under heel area   Pain Descriptors / Indicators Sharp;Burning    Pain Type Acute pain    Pain Onset More than a month ago    Pain Frequency Intermittent    Aggravating Factors  standing >30 min,    Pain Relieving  Factors stay off of it                [facility] PT Assessment - 08/12/21 0810       Assessment   Medical Diagnosis M79.672,G89.29 (ICD-10-CM) - Heel pain, chronic, left    Referring Provider (PT) Bevely Palmer Persons, PA    Onset Date/Surgical Date 07/31/21   MD referral to PT   Hand Dominance Right    Prior Therapy years ago with ankle / foot pain      Precautions   Precautions None      Restrictions   Weight Bearing Restrictions No      Balance Screen   Has the patient fallen in the past 6 months No    Has the patient had a decrease in activity level because of a fear of falling?  No    Is the patient reluctant to leave their home because of a fear of falling?  No      Home Environment   Living Environment Private residence    Living Arrangements Alone    Type of [place] to enter    Entrance Stairs-Number  of Steps 3    Entrance Stairs-Rails None    Home Layout Two level;1/2 bath on main level;Bed/bath upstairs    Alternate Level Stairs-Number of Steps 14    Alternate Level Stairs-Rails Right      Prior Function   Level of Independence Independent with household mobility without device;Independent;Independent with community mobility without device    Vocation Full time employment    Vocation Requirements [employer] meetings & office work, occ at event in [employer]    Leisure would like to play tennis again      Observation/Other Assessments   Focus on Therapeutic Outcomes (FOTO)  51% target goal 72%      Observation/Other Assessments-Edema    Edema Circumferential      Circumferential Edema   Circumferential - Right 55cm    Circumferential - Left  55cm      Functional Tests   Functional tests Single leg stance      Single Leg Stance   Comments LLE = 12.53sec & RLE = 29.25sec      ROM / Strength   AROM / PROM / Strength AROM;PROM;Strength      AROM   AROM Assessment Site Ankle    Right Ankle Dorsiflexion 2   +2 with knee ext, +14  with knee flex   Right Ankle Plantar Flexion 68    Left Ankle Dorsiflexion 0   neutral only with knee ext, +9 with knee flex   Left Ankle Plantar Flexion 68    Left Ankle Inversion 12   prone knee flex   Left Ankle Eversion 6   prone with knee flex     Strength   Strength Assessment Site Ankle    Right Ankle Plantar Flexion 4+/5   standing with light UE on PT hands,  21 reps   Left Ankle Dorsiflexion 5/5    Left Ankle Plantar Flexion 4-/5   standing with light UE on PT hands,  11 reps   Left Ankle Inversion 5/5    Left Ankle Eversion 5/5      Ambulation/Gait   Ambulation/Gait Yes    Ambulation/Gait Assistance 6: Modified independent (Device/Increase time)    Assistive device None    Gait Pattern Step-through pattern;Decreased step length - right;Wide base of support   impaired forefoot & toe rockers in late stance decreasing heel rise & push-off   Ambulation Surface Level;Indoor    Gait velocity gait velocity comfortable 3.65 ft/sec & fast 4.82 ft/sec with increased pain & deviations.    Stairs Yes    Stairs Assistance 6: Modified independent (Device/Increase time)    Stairs Assistance Details (indicate cue type and reason) power = 45.45watts  Step Climbing Power (watts) = body wt (136,1 kg) X gravity (9.8 m/s2) X step ht (0.018) X 11#steps  /  time 5.81 (sec)    Stair Management Technique No rails;Alternating pattern;Forwards    Number of Stairs 11    Height of Stairs 18   18cm or 7"                       Objective measurements completed on examination: See above findings.       [facility] Adult PT Treatment/Exercise - 08/12/21 0810       Self-Care   Self-Care Other Self-Care Comments    Other Self-Care Comments  PT educated on footwear as her shoes are >45 year old      Exercises   Exercises Ankle;Other Exercises  Other Exercises  HEP Access Code: [email or web address]  pt return demo 1 set of all exercises.                     PT Education - 08/12/21 0841      Education Details Access Code: [email or web address]    Person(s) Educated Patient    Methods Explanation;Demonstration;Tactile cues;Verbal cues;Handout    Comprehension Verbalized understanding;Returned demonstration                 PT Long Term Goals - 08/12/21 0917       PT LONG TERM GOAL #1   Title FOTO >/= 72%    Time 4    Period Weeks    Status New    Target Date 09/06/21      PT LONG TERM GOAL #2   Title Pt reports left ankle / foot pain < 2/10 with standing & gait activites.    Time 4    Period Weeks    Status New    Target Date 09/06/21      PT LONG TERM GOAL #3   Title Left ankle AROM within 5* of RLE    Time 4    Period Weeks    Status New    Target Date 09/06/21      PT LONG TERM GOAL #4   Title Gait velocity fast pace >5.5 ft/sec without increase in pain    Time 4    Period Weeks    Status New    Target Date 09/06/21      PT LONG TERM GOAL #5   Title stairs power >55 watts    Baseline Step Climbing Power (watts) = body wt (kg) X gravity (9.8 m/s2) X step ht (m) X #steps  /  time (sec)    Time 4    Period Weeks    Status New    Target Date 09/06/21                    Plan - 08/12/21 0910     Clinical Impression Statement This 45yo female presents to PT with left foot pain which has increased in intensity over last month and is limiting her mobility.  She has tightness & weakness noted for left ankle.  Her balance and gait are negatively impacted by left ankle / foot mechanics & pain.  Patient would benefit from PT services to address deficits.    Examination-Activity Limitations Locomotion Level;Squat;Stairs;Stand    Examination-Participation Restrictions Community Activity;Occupation    Stability/Clinical Decision Making Stable/Uncomplicated    Clinical Decision Making Low    Rehab Potential Good    PT Frequency 2x / week    PT Duration 4 weeks    PT Treatment/Interventions ADLs/Self Care Home Management;Cryotherapy;Electrical  Stimulation;Moist Heat;Ultrasound;DME Instruction;Gait training;Stair training;Functional mobility training;Therapeutic activities;Therapeutic exercise;Balance training;Neuromuscular re-education;Patient/family education;Orthotic Fit/Training;Manual techniques;Taping;Joint Manipulations    PT Next Visit Plan check & update HEP,  gait working on push off on level surfaces & stairs, therapeutic exercise & manual therapy to left ankle    PT Home Exercise Plan Access Code: [email or web address]    Recommended Other Services may benefit from new shoes as she reports >year old and night Plantar Fascitis splint    Consulted and Agree with Plan of Care Patient             Patient will benefit from skilled therapeutic intervention in order to improve the following deficits and impairments:  Abnormal gait, Decreased balance, Decreased endurance,  Decreased mobility, Decreased range of motion, Decreased strength, Obesity, Pain  Visit Diagnosis: Stiffness of left ankle, not elsewhere classified  Pain in left ankle and joints of left foot  Muscle weakness (generalized)  Other abnormalities of gait and mobility  Unsteadiness on feet     Problem List Patient Active Problem List   Diagnosis Date Noted   Contusion of right knee 09/20/2020   Prediabetes 08/14/2020   Mixed hyperlipidemia 08/10/2020   Seasonal allergies 08/10/2020   Pain in right shoulder 08/07/2020   Carpal tunnel syndrome, bilateral 08/07/2020   H/O carpal tunnel repair 11/04/2018   Routine general medical examination at a health care facility 09/12/2014   Anxiety 09/17/2012   Class 3 severe obesity due to excess calories with body mass index (BMI) of 50.0 to 59.9 in adult [facility]) 06/30/2011    Jamey Reas, PT, DPT 08/12/2021, 9:22 AM  [facility] Physical Therapy [address] [place], [place], [address] Phone: [phone]   Fax:  [phone]  Name: JERILEE SPACE MRN: [id number] Date of Birth:  1976/04/22

## 2021-08-14 ENCOUNTER — Encounter: Payer: 59 | Admitting: Family Medicine

## 2021-08-15 ENCOUNTER — Encounter: Payer: 59 | Admitting: Family Medicine

## 2021-08-16 ENCOUNTER — Ambulatory Visit: Payer: 59 | Admitting: Rehabilitative and Restorative Service Providers"

## 2021-08-16 ENCOUNTER — Encounter: Payer: Self-pay | Admitting: Rehabilitative and Restorative Service Providers"

## 2021-08-16 ENCOUNTER — Other Ambulatory Visit: Payer: Self-pay

## 2021-08-16 DIAGNOSIS — M25672 Stiffness of left ankle, not elsewhere classified: Secondary | ICD-10-CM

## 2021-08-16 DIAGNOSIS — M6281 Muscle weakness (generalized): Secondary | ICD-10-CM

## 2021-08-16 DIAGNOSIS — R2689 Other abnormalities of gait and mobility: Secondary | ICD-10-CM | POA: Diagnosis not present

## 2021-08-16 DIAGNOSIS — M25572 Pain in left ankle and joints of left foot: Secondary | ICD-10-CM

## 2021-08-16 NOTE — Patient Instructions (Signed)
Access Code: [email or web address] URL: [email or web address] Date: 08/16/2021 Prepared by: Vista Mink  Exercises  Standing Eccentric Heel Raise - 1 x daily - 7 x weekly - 3 sets - 10 reps - 5 seconds hold  100/Day  Slant Board Gastrocnemius Stretch - 2-3 x daily - 7 x weekly - 1 sets - 3 reps - 60 seconds hold Slant Board Soleus Stretch - 2-3 x daily - 7 x weekly - 1 sets - 3 reps - 60 seconds hold

## 2021-08-16 NOTE — Therapy (Signed)
[facility] Physical Therapy [address] [place], [place], [address] Phone: [phone]   Fax:  [phone]  Physical Therapy Treatment  Patient Details  Name: Erika Mullins MRN: [id number] Date of Birth: 1975-12-21 Referring Provider (PT): Bevely Palmer Persons, [place]   Encounter Date: 08/16/2021   PT End of Session - 08/16/21 1610     Visit Number 2    Number of Visits 8    Date for PT Re-Evaluation 09/06/21    Authorization Type UHC    Authorization Time Period $25 copay, 60 visits, 1 visit prior to PT eval    Authorization - Visit Number 2    Authorization - Number of Visits 60    PT Start Time 1522    PT Stop Time 1600    PT Time Calculation (min) 38 min    Activity Tolerance Patient tolerated treatment well;No increased pain    Behavior During Therapy WFL for tasks assessed/performed             Past Medical History:  Diagnosis Date   Adult acne    Dysfunctional uterine bleeding    Obesity     Past Surgical History:  Procedure Laterality Date   CARPAL TUNNEL RELEASE  09/2010   Right   TONSILLECTOMY AND ADENOIDECTOMY     TYMPANOSTOMY TUBE PLACEMENT     bilateral    There were no vitals filed for this visit.   Subjective Assessment - 08/16/21 1608     Subjective Erika Mullins reports good early HEP compliance.    Pertinent History CTS with release, obesity    Patient Stated Goals To be pain free with activities    Currently in Pain? Yes    Pain Score 5     Pain Location Foot    Pain Orientation Left;Medial    Pain Descriptors / Indicators Burning;Sharp    Pain Type Acute pain    Pain Radiating Towards NA    Pain Onset More than a month ago    Pain Frequency Intermittent    Aggravating Factors  WB    Pain Relieving Factors Rest    Effect of Pain on Daily Activities Limits WB endurance    Multiple Pain Sites No                               [facility] Adult PT Treatment/Exercise - 08/16/21 0001       Exercises    Exercises Ankle      Ankle Exercises: Stretches   Soleus Stretch 3 reps;20 seconds;60 seconds;Limitations    Soleus Stretch Limitations 20 seconds standing and 60 seconds slant board both with slight toe in    Gastroc Stretch 3 reps;20 seconds;60 seconds;Limitations    Gastroc Stretch Limitations 20 seconds standing and 60 seconds slant board both with slight toe in    Slant Board Stretch 3 reps;60 seconds    Slant Board Stretch Limitations Knees straight and bent slight toe in      Ankle Exercises: Machines for Strengthening   Cybex Leg Press 10X each for leg press and heel raise with slow eccentrics 150#      Ankle Exercises: Standing   Heel Raises Both;10 reps;5 seconds;Limitations    Heel Raises Limitations slow eccentrics, up with both down on one                     PT Education - 08/16/21 1610     Education  Details Discussed HEP and reviewed selected exercises.  Added slant board knees straight (gastrocnemius) and bent (soleus).    Person(s) Educated Patient    Methods Explanation;Demonstration;Verbal cues;Handout    Comprehension Verbalized understanding;Need further instruction;Returned demonstration;Verbal cues required                 PT Long Term Goals - 08/12/21 0917       PT LONG TERM GOAL #1   Title FOTO >/= 72%    Time 4    Period Weeks    Status New    Target Date 09/06/21      PT LONG TERM GOAL #2   Title Pt reports left ankle / foot pain < 2/10 with standing & gait activites.    Time 4    Period Weeks    Status New    Target Date 09/06/21      PT LONG TERM GOAL #3   Title Left ankle AROM within 5* of RLE    Time 4    Period Weeks    Status New    Target Date 09/06/21      PT LONG TERM GOAL #4   Title Gait velocity fast pace >5.5 ft/sec without increase in pain    Time 4    Period Weeks    Status New    Target Date 09/06/21      PT LONG TERM GOAL #5   Title stairs power >55 watts    Baseline Step Climbing Power  (watts) = body wt (kg) X gravity (9.8 m/s2) X step ht (m) X #steps  /  time (sec)    Time 4    Period Weeks    Status New    Target Date 09/06/21                   Plan - 08/16/21 1611     Clinical Impression Statement Erika Mullins reports and demonstrates good early HEP compliance.  Added slant board for a good long-term HEP exercise given her previous bouts with Achilles' tendonitis.  Emphasized heel raises with slow eccentrics from current HEP and encouraged her to work towards 100/Day.    Examination-Activity Limitations Locomotion Level;Squat;Stairs;Stand    Examination-Participation Restrictions Community Activity;Occupation    Stability/Clinical Decision Making Stable/Uncomplicated    Rehab Potential Good    PT Frequency 2x / week    PT Duration 4 weeks    PT Treatment/Interventions ADLs/Self Care Home Management;Cryotherapy;Electrical Stimulation;Moist Heat;Ultrasound;DME Instruction;Gait training;Stair training;Functional mobility training;Therapeutic activities;Therapeutic exercise;Balance training;Neuromuscular re-education;Patient/family education;Orthotic Fit/Training;Manual techniques;Taping;Joint Manipulations    PT Next Visit Plan Check progress with HEP, consider steps and balance    PT Home Exercise Plan Access Code: [email or web address]    Recommended Other Services Better shoes today    Consulted and Agree with Plan of Care Patient             Patient will benefit from skilled therapeutic intervention in order to improve the following deficits and impairments:  Abnormal gait, Decreased balance, Decreased endurance, Decreased mobility, Decreased range of motion, Decreased strength, Obesity, Pain  Visit Diagnosis: Stiffness of left ankle, not elsewhere classified  Pain in left ankle and joints of left foot  Muscle weakness (generalized)  Other abnormalities of gait and mobility     Problem List Patient Active Problem List   Diagnosis Date Noted   Contusion of  right knee 09/20/2020   Prediabetes 08/14/2020   Mixed hyperlipidemia 08/10/2020   Seasonal allergies 08/10/2020   Pain in right  shoulder 08/07/2020   Carpal tunnel syndrome, bilateral 08/07/2020   H/O carpal tunnel repair 11/04/2018   Routine general medical examination at a health care facility 09/12/2014   Anxiety 09/17/2012   Class 3 severe obesity due to excess calories with body mass index (BMI) of 50.0 to 59.9 in adult [facility]) 06/30/2011    Farley Ly, PT, MPT 08/16/2021, 4:14 PM  [facility] Physical Therapy [address] [place], [place], [address] Phone: [phone]   Fax:  [phone]  Name: Erika Mullins MRN: [id number] Date of Birth: 1976/09/01

## 2021-08-19 ENCOUNTER — Other Ambulatory Visit: Payer: Self-pay

## 2021-08-19 ENCOUNTER — Encounter: Payer: Self-pay | Admitting: Physical Therapy

## 2021-08-19 ENCOUNTER — Ambulatory Visit: Payer: 59 | Admitting: Physical Therapy

## 2021-08-19 DIAGNOSIS — M6281 Muscle weakness (generalized): Secondary | ICD-10-CM | POA: Diagnosis not present

## 2021-08-19 DIAGNOSIS — M25572 Pain in left ankle and joints of left foot: Secondary | ICD-10-CM | POA: Diagnosis not present

## 2021-08-19 DIAGNOSIS — M25672 Stiffness of left ankle, not elsewhere classified: Secondary | ICD-10-CM | POA: Diagnosis not present

## 2021-08-19 DIAGNOSIS — R2689 Other abnormalities of gait and mobility: Secondary | ICD-10-CM

## 2021-08-19 NOTE — Therapy (Signed)
[facility] Physical Therapy [address] [place], [place], [address] Phone: [phone]   Fax:  [phone]  Physical Therapy Treatment  Patient Details  Name: Erika Mullins MRN: [id number] Date of Birth: [birth year] Referring Provider (PT): Bevely Palmer Persons, [place]   Encounter Date: 08/19/2021   PT End of Session - 08/19/21 0916     Visit Number 3    Number of Visits 8    Date for PT Re-Evaluation 09/06/21    Authorization Type UHC    Authorization Time Period $25 copay, 60 visits, 1 visit prior to PT eval    Authorization - Visit Number 3    Authorization - Number of Visits 60    PT Start Time 0850    PT Stop Time 0928    PT Time Calculation (min) 38 min    Activity Tolerance Patient tolerated treatment well;No increased pain    Behavior During Therapy WFL for tasks assessed/performed             Past Medical History:  Diagnosis Date   Adult acne    Dysfunctional uterine bleeding    Obesity     Past Surgical History:  Procedure Laterality Date   CARPAL TUNNEL RELEASE  09/2010   Right   TONSILLECTOMY AND ADENOIDECTOMY     TYMPANOSTOMY TUBE PLACEMENT     bilateral    There were no vitals filed for this visit.   Subjective Assessment - 08/19/21 0913     Subjective Pt reporting increased pain today after standing on her feet all day yesterday.    Patient Stated Goals To be pain free with activities    Currently in Pain? Yes    Pain Score 7     Pain Location Foot    Pain Orientation Left    Pain Descriptors / Indicators Aching    Pain Type Acute pain    Pain Onset More than a month ago                               [facility] Adult PT Treatment/Exercise - 08/19/21 0001       Neuro Re-ed    Neuro Re-ed Details  blance board x 2 minutes with intermittent support, both direction      Exercises   Exercises Ankle      Manual Therapy   Manual Therapy Soft tissue mobilization    Manual therapy comments IASTM to  achilles, plantar fascia, peroneals and medial ankle of left foot      Ankle Exercises: Stretches   Soleus Stretch 3 reps;20 seconds;60 seconds;Limitations    Soleus Stretch Limitations slant board    Gastroc Stretch 3 reps;20 seconds;60 seconds;Limitations    Gastroc Stretch Limitations slant board    Slant Board Stretch 3 reps;60 seconds      Ankle Exercises: Machines for Strengthening   Cybex Leg Press 2x10 heel raises 100#, leg press x20 125#      Ankle Exercises: Standing   Heel Raises Both;10 reps;5 seconds;Limitations    Heel Raises Limitations slow eccentrics, up with both down on one    Toe Raise 2 seconds;15 reps                          PT Long Term Goals - 08/19/21 0916       PT LONG TERM GOAL #1   Title FOTO >/= 72%    Status On-going  PT LONG TERM GOAL #2   Title Pt reports left ankle / foot pain < 2/10 with standing & gait activites.    Status On-going      PT LONG TERM GOAL #3   Title Left ankle AROM within 5* of RLE    Status On-going      PT LONG TERM GOAL #4   Title Gait velocity fast pace >5.5 ft/sec without increase in pain    Status On-going      PT LONG TERM GOAL #5   Title stairs power >55 watts    Status On-going                   Plan - 08/19/21 0917     Clinical Impression Statement Pt with good response to IASTM to medial ankle, peroneals, achilles, and plantar fascia. Pt reporting 4/10 pain at end of session. Continue skilled PT to maximize funciton.    Examination-Activity Limitations Locomotion Level;Squat;Stairs;Stand    Examination-Participation Restrictions Community Activity;Occupation    Stability/Clinical Decision Making Stable/Uncomplicated    Rehab Potential Good    PT Frequency 2x / week    PT Duration 4 weeks    PT Treatment/Interventions ADLs/Self Care Home Management;Cryotherapy;Electrical Stimulation;Moist Heat;Ultrasound;DME Instruction;Gait training;Stair training;Functional mobility  training;Therapeutic activities;Therapeutic exercise;Balance training;Neuromuscular re-education;Patient/family education;Orthotic Fit/Training;Manual techniques;Taping;Joint Manipulations    PT Next Visit Plan IASTM, steps, SLS, dynamci balance    PT Home Exercise Plan Access Code: [email or web address]    Consulted and Agree with Plan of Care Patient             Patient will benefit from skilled therapeutic intervention in order to improve the following deficits and impairments:  Abnormal gait, Decreased balance, Decreased endurance, Decreased mobility, Decreased range of motion, Decreased strength, Obesity, Pain  Visit Diagnosis: Stiffness of left ankle, not elsewhere classified  Pain in left ankle and joints of left foot  Muscle weakness (generalized)  Other abnormalities of gait and mobility     Problem List Patient Active Problem List   Diagnosis Date Noted   Contusion of right knee 09/20/2020   Prediabetes 08/14/2020   Mixed hyperlipidemia 08/10/2020   Seasonal allergies 08/10/2020   Pain in right shoulder 08/07/2020   Carpal tunnel syndrome, bilateral 08/07/2020   H/O carpal tunnel repair 11/04/2018   Routine general medical examination at a health care facility 09/12/2014   Anxiety 09/17/2012   Class 3 severe obesity due to excess calories with body mass index (BMI) of 50.0 to 59.9 in adult [facility]) 06/30/2011    Oretha Caprice, PT, MPT 08/19/2021, 9:30 AM  [facility] Physical Therapy [address] [place], [place], [address] Phone: [phone]   Fax:  [phone]  Name: Erika Mullins MRN: [id number] Date of Birth: [birth year]

## 2021-08-21 ENCOUNTER — Ambulatory Visit (INDEPENDENT_AMBULATORY_CARE_PROVIDER_SITE_OTHER): Payer: 59 | Admitting: Family Medicine

## 2021-08-21 ENCOUNTER — Other Ambulatory Visit: Payer: Self-pay

## 2021-08-21 ENCOUNTER — Encounter: Payer: Self-pay | Admitting: Family Medicine

## 2021-08-21 ENCOUNTER — Encounter: Payer: Self-pay | Admitting: Physical Therapy

## 2021-08-21 ENCOUNTER — Ambulatory Visit: Payer: 59 | Admitting: Physical Therapy

## 2021-08-21 VITALS — BP 112/82 | HR 83 | Temp 98.7°F | Ht 69.0 in | Wt 337.6 lb

## 2021-08-21 DIAGNOSIS — M6281 Muscle weakness (generalized): Secondary | ICD-10-CM

## 2021-08-21 DIAGNOSIS — R7303 Prediabetes: Secondary | ICD-10-CM

## 2021-08-21 DIAGNOSIS — Z1159 Encounter for screening for other viral diseases: Secondary | ICD-10-CM

## 2021-08-21 DIAGNOSIS — R400 Somnolence: Secondary | ICD-10-CM | POA: Diagnosis not present

## 2021-08-21 DIAGNOSIS — E782 Mixed hyperlipidemia: Secondary | ICD-10-CM

## 2021-08-21 DIAGNOSIS — M25672 Stiffness of left ankle, not elsewhere classified: Secondary | ICD-10-CM

## 2021-08-21 DIAGNOSIS — R2689 Other abnormalities of gait and mobility: Secondary | ICD-10-CM | POA: Diagnosis not present

## 2021-08-21 DIAGNOSIS — D75839 Thrombocytosis, unspecified: Secondary | ICD-10-CM

## 2021-08-21 DIAGNOSIS — R5383 Other fatigue: Secondary | ICD-10-CM

## 2021-08-21 DIAGNOSIS — F419 Anxiety disorder, unspecified: Secondary | ICD-10-CM

## 2021-08-21 DIAGNOSIS — M25572 Pain in left ankle and joints of left foot: Secondary | ICD-10-CM

## 2021-08-21 DIAGNOSIS — Z3041 Encounter for surveillance of contraceptive pills: Secondary | ICD-10-CM

## 2021-08-21 DIAGNOSIS — D72828 Other elevated white blood cell count: Secondary | ICD-10-CM

## 2021-08-21 DIAGNOSIS — R2681 Unsteadiness on feet: Secondary | ICD-10-CM

## 2021-08-21 DIAGNOSIS — Z0000 Encounter for general adult medical examination without abnormal findings: Secondary | ICD-10-CM

## 2021-08-21 LAB — CBC WITH DIFFERENTIAL/PLATELET
Basophils Absolute: 0.1 10*3/uL (ref 0.0–0.1)
Basophils Relative: 0.4 % (ref 0.0–3.0)
Eosinophils Absolute: 0.3 10*3/uL (ref 0.0–0.7)
Eosinophils Relative: 1.9 % (ref 0.0–5.0)
HCT: 41.9 % (ref 36.0–46.0)
Hemoglobin: 13.6 g/dL (ref 12.0–15.0)
Lymphocytes Relative: 29.7 % (ref 12.0–46.0)
Lymphs Abs: 4.4 10*3/uL — ABNORMAL HIGH (ref 0.7–4.0)
MCHC: 32.3 g/dL (ref 30.0–36.0)
MCV: 85.2 fl (ref 78.0–100.0)
Monocytes Absolute: 0.7 10*3/uL (ref 0.1–1.0)
Monocytes Relative: 5 % (ref 3.0–12.0)
Neutro Abs: 9.3 10*3/uL — ABNORMAL HIGH (ref 1.4–7.7)
Neutrophils Relative %: 63 % (ref 43.0–77.0)
Platelets: 518 10*3/uL — ABNORMAL HIGH (ref 150.0–400.0)
RBC: 4.92 Mil/uL (ref 3.87–5.11)
RDW: 13.3 % (ref 11.5–15.5)
WBC: 14.7 10*3/uL — ABNORMAL HIGH (ref 4.0–10.5)

## 2021-08-21 LAB — VITAMIN D 25 HYDROXY (VIT D DEFICIENCY, FRACTURES): VITD: 18.17 ng/mL — ABNORMAL LOW (ref 30.00–100.00)

## 2021-08-21 LAB — T4, FREE: Free T4: 0.71 ng/dL (ref 0.60–1.60)

## 2021-08-21 LAB — HEPATITIS C ANTIBODY
Hepatitis C Ab: NONREACTIVE
SIGNAL TO CUT-OFF: 0.06 (ref <1.00)

## 2021-08-21 LAB — LIPID PANEL
Cholesterol: 247 mg/dL — ABNORMAL HIGH (ref 0–200)
HDL: 41.6 mg/dL (ref >39.00)
NonHDL: 204.93
Total CHOL/HDL Ratio: 6
Triglycerides: 328 mg/dL — ABNORMAL HIGH (ref 0.0–149.0)
VLDL: 65.6 mg/dL — ABNORMAL HIGH (ref 0.0–40.0)

## 2021-08-21 LAB — BASIC METABOLIC PANEL
BUN: 8 mg/dL (ref 6–23)
CO2: 27 mEq/L (ref 19–32)
Calcium: 9 mg/dL (ref 8.4–10.5)
Chloride: 100 mEq/L (ref 96–112)
Creatinine, Ser: 0.73 mg/dL (ref 0.40–1.20)
GFR: 100.48 mL/min (ref >60.00)
Glucose, Bld: 102 mg/dL — ABNORMAL HIGH (ref 70–99)
Potassium: 3.8 mEq/L (ref 3.5–5.1)
Sodium: 136 mEq/L (ref 135–145)

## 2021-08-21 LAB — LDL CHOLESTEROL, DIRECT: Direct LDL: 167 mg/dL

## 2021-08-21 LAB — TSH: TSH: 1.85 u[IU]/mL (ref 0.35–5.50)

## 2021-08-21 LAB — HEMOGLOBIN A1C: Hgb A1c MFr Bld: 6.5 % (ref 4.6–6.5)

## 2021-08-21 LAB — VITAMIN B12: Vitamin B-12: 209 pg/mL — ABNORMAL LOW (ref 211–911)

## 2021-08-21 MED ORDER — NORTREL 7/7/7 0.5/0.75/1-35 MG-MCG PO TABS: 1.0000 | ORAL_TABLET | Freq: Every day | ORAL | 3 refills | Status: DC

## 2021-08-21 MED ORDER — CITALOPRAM HYDROBROMIDE 40 MG PO TABS: 40.0000 mg | ORAL_TABLET | Freq: Every day | ORAL | 1 refills | Status: DC

## 2021-08-21 NOTE — Therapy (Signed)
[facility] Physical Therapy [address] [place], [place], [address] Phone: [phone]   Fax:  [phone]  Physical Therapy Treatment  Patient Details  Name: Erika Mullins MRN: [id number] Date of Birth: Jul 12, 1976 Referring Provider (PT): Bevely Palmer Persons, [place]   Encounter Date: 08/21/2021   PT End of Session - 08/21/21 0909     Visit Number 4    Number of Visits 8    Date for PT Re-Evaluation 09/06/21    Authorization Type UHC    Authorization Time Period $25 copay, 60 visits, 1 visit prior to PT eval    Authorization - Visit Number 4    Authorization - Number of Visits 60    PT Start Time 0845    PT Stop Time 0925    PT Time Calculation (min) 40 min    Activity Tolerance Patient tolerated treatment well;No increased pain    Behavior During Therapy WFL for tasks assessed/performed             Past Medical History:  Diagnosis Date   Adult acne    Dysfunctional uterine bleeding    Obesity     Past Surgical History:  Procedure Laterality Date   CARPAL TUNNEL RELEASE  09/2010   Right   TONSILLECTOMY AND ADENOIDECTOMY     TYMPANOSTOMY TUBE PLACEMENT     bilateral    There were no vitals filed for this visit.   Subjective Assessment - 08/21/21 0907     Subjective Pt arriving reporting 6-7/10 pain when walking. Pt reporting feeling better with sitting.    Pertinent History CTS with release, obesity    Patient Stated Goals To be pain free with activities    Currently in Pain? Yes    Pain Score 7     Pain Location Foot    Pain Orientation Left    Pain Descriptors / Indicators Aching;Sharp    Pain Type Acute pain    Pain Onset More than a month ago                [facility] PT Assessment - 08/21/21 0001       Assessment   Medical Diagnosis M79.672,G89.29 (ICD-10-CM) - Heel pain, chronic, left    Referring Provider (PT) Bevely Palmer Persons, PA    Onset Date/Surgical Date 07/31/21   MD referral to PT   Hand Dominance Right       Observation/Other Assessments-Edema    Edema Circumferential      Circumferential Edema   Circumferential - Right 55cm    Circumferential - Left  55cm      AROM   AROM Assessment Site Ankle    Right Ankle Dorsiflexion 10    Left Ankle Dorsiflexion 12    Left Ankle Inversion 28    Left Ankle Eversion 26                           [facility] Adult PT Treatment/Exercise - 08/21/21 0001       Neuro Re-ed    Neuro Re-ed Details  AIrex: SLS left x 1 minute with intermittnet UE support, Vectors using sliding board, side stepping toward 3 cones stepping,      Exercises   Exercises Ankle      Manual Therapy   Manual Therapy Soft tissue mobilization    Manual therapy comments IASTM to achilles, plantar fascia, peroneals and medial ankle of left foot      Ankle Exercises: Stretches  Soleus Stretch 3 reps;20 seconds;60 seconds;Limitations    Soleus Stretch Limitations slant board    Gastroc Stretch 3 reps;20 seconds;60 seconds;Limitations    Gastroc Stretch Limitations slant board    Slant Board Stretch 3 reps;60 seconds      Ankle Exercises: Machines for Strengthening   Cybex Leg Press 2x10 heel raises 100#, leg press x20 125#      Ankle Exercises: Standing   Heel Raises Both;10 reps;5 seconds;Limitations    Heel Raises Limitations slow eccentrics, up with both down on one    Toe Raise 2 seconds;15 reps                          PT Long Term Goals - 08/19/21 0916       PT LONG TERM GOAL #1   Title FOTO >/= 72%    Status On-going      PT LONG TERM GOAL #2   Title Pt reports left ankle / foot pain < 2/10 with standing & gait activites.    Status On-going      PT LONG TERM GOAL #3   Title Left ankle AROM within 5* of RLE    Status On-going      PT LONG TERM GOAL #4   Title Gait velocity fast pace >5.5 ft/sec without increase in pain    Status On-going      PT LONG TERM GOAL #5   Title stairs power >55 watts    Status On-going                    Plan - 08/21/21 0928     Clinical Impression Statement Pt has made progress with left ankle AROM. Good response to IASTM to medial ankle, achilles, and plantar fascia. Pt reporting 7/10 pain upon arrival and less pain at end of session of 5-6/10. Continue skilled PT to maximize funciton.    Examination-Activity Limitations Locomotion Level;Squat;Stairs;Stand    Examination-Participation Restrictions Community Activity;Occupation    Stability/Clinical Decision Making Stable/Uncomplicated    Rehab Potential Good    PT Frequency 2x / week    PT Duration 4 weeks    PT Treatment/Interventions ADLs/Self Care Home Management;Cryotherapy;Electrical Stimulation;Moist Heat;Ultrasound;DME Instruction;Gait training;Stair training;Functional mobility training;Therapeutic activities;Therapeutic exercise;Balance training;Neuromuscular re-education;Patient/family education;Orthotic Fit/Training;Manual techniques;Taping;Joint Manipulations    PT Next Visit Plan IASTM, steps, SLS, dynamci balance    PT Home Exercise Plan Access Code: [email or web address]    Consulted and Agree with Plan of Care Patient             Patient will benefit from skilled therapeutic intervention in order to improve the following deficits and impairments:  Abnormal gait, Decreased balance, Decreased endurance, Decreased mobility, Decreased range of motion, Decreased strength, Obesity, Pain  Visit Diagnosis: Stiffness of left ankle, not elsewhere classified  Pain in left ankle and joints of left foot  Muscle weakness (generalized)  Other abnormalities of gait and mobility  Unsteadiness on feet     Problem List Patient Active Problem List   Diagnosis Date Noted   Contusion of right knee 09/20/2020   Prediabetes 08/14/2020   Mixed hyperlipidemia 08/10/2020   Seasonal allergies 08/10/2020   Pain in right shoulder 08/07/2020   Carpal tunnel syndrome, bilateral 08/07/2020   H/O carpal tunnel repair 11/04/2018    Routine general medical examination at a health care facility 09/12/2014   Anxiety 09/17/2012   Class 3 severe obesity due to excess calories with body mass index (BMI) of  50.0 to 59.9 in adult [facility]) 06/30/2011    Oretha Caprice, PT, MPT 08/21/2021, 9:30 AM  [facility] Physical Therapy [address] [place], [place], [address] Phone: [phone]   Fax:  [phone]  Name: Erika Mullins MRN: [id number] Date of Birth: [birth year]

## 2021-08-21 NOTE — Progress Notes (Signed)
Subjective:     Erika Mullins is a 45 y.o. female and is here for a comprehensive physical exam. The patient reports doing well.  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  Motivated to lose weight.  Endorses continued fatigue.  Forgetting to take vitamin D.  Also notes daytime somnolence.  Can fall asleep anytime.  In PT for L plantar fasciitis.  Pap done last year, 08/10/2020 normal.  Mammogram done 06/26/2021.  Social History   Socioeconomic History   Marital status: Single    Spouse name: Not on file   Number of children: Not on file   Years of education: Not on file   Highest education level: Not on file  Occupational History   Not on file  Tobacco Use   Smoking status: Never   Smokeless tobacco: Never  Substance and Sexual Activity   Alcohol use: Yes    Comment: large glass of wine 3-4 nights/week   Drug use: No   Sexual activity: Not on file  Other Topics Concern   Not on file  Social History Narrative   Not on file   Social Determinants of Health   Financial Resource Strain: Not on file  Food Insecurity: Not on file  Transportation Needs: Not on file  Physical Activity: Not on file  Stress: Not on file  Social Connections: Not on file  Intimate Partner Violence: Not on file   Health Maintenance  Topic Date Due   Pneumococcal Vaccine 3-53 Years old (1 - PCV) Never done   HIV Screening  Never done   Hepatitis C Screening  Never done   TETANUS/TDAP  06/29/2021   COVID-19 Vaccine (5 - Booster for Pfizer series) 09/08/2021   PAP SMEAR-Modifier  08/11/2023   INFLUENZA VACCINE  Completed   HPV VACCINES  Aged Out    The following portions of the patient's history were reviewed and updated as appropriate: allergies, current medications, past family history, past medical history, past social history, past surgical history, and problem list.  Review of Systems Pertinent items noted in HPI and remainder of comprehensive ROS otherwise negative.   Objective:    BP  112/82 (BP Location: Left Arm, Patient Position: Sitting, Cuff Size: Normal)   Pulse 83   Temp 98.7 F (37.1 C) (Oral)   Ht 5\' 9"  (1.753 m)   Wt (!) 337 lb 9.6 oz (153.1 kg)   LMP 08/14/2021   SpO2 97%   BMI 49.85 kg/m  General appearance: alert, cooperative, and no distress Head: Normocephalic, without obvious abnormality, atraumatic Eyes: conjunctivae/corneas clear. PERRL, EOM's intact. Fundi benign. Ears: normal TM's and external ear canals both ears Nose: Nares normal. Septum midline. Mucosa normal. No drainage or sinus tenderness. Throat: lips, mucosa, and tongue normal; teeth and gums normal Neck: no adenopathy, no carotid bruit, no JVD, supple, symmetrical, trachea midline, and thyroid not enlarged, symmetric, no tenderness/mass/nodules Lungs: clear to auscultation bilaterally Heart: regular rate and rhythm, S1, S2 normal, no murmur, click, rub or gallop Abdomen: soft, non-tender; bowel sounds normal; no masses,  no organomegaly Extremities: extremities normal, atraumatic, no cyanosis or edema Pulses: 2+ and symmetric Skin: Skin color, texture, turgor normal. No rashes or lesions Lymph nodes: Cervical, supraclavicular, and axillary nodes normal. Neurologic: Alert and oriented X 3, normal strength and tone. Normal symmetric reflexes. Normal coordination and gait    Assessment:    Healthy female exam.      Plan:    Anticipatory guidance given including wearing seatbelts, smoke  detectors in the home, increasing physical activity, increasing p.o. intake of water and vegetables. -will obtain labs -Mammogram done 06/26/2021 -Immunizations reviewed and up-to-date -Colonoscopy not yet indicated 2/2 age -Pap done last year, 05/10/2020.  Repeat Pap due in 2026 See After Visit Summary for Counseling Recommendations   Fatigue, unspecified type  -h/o vit D def.  OSA may also be contributing - Plan: CBC with Differential/Platelet, TSH, T4, Free, Basic metabolic panel, Vitamin D52,  Ambulatory referral to Sleep Studies  Daytime somnolence  - Plan: Ambulatory referral to Sleep Studies  Morbid obesity ([place])  -Encouraged to continue working treatment -Continue lifestyle modifications such as reading labels on foods - Plan: Hemoglobin A1c, Lipid panel, Vitamin D, 25-hydroxy, Ambulatory referral to Sleep Studies  Mixed hyperlipidemia -LDL, total, triglycerides elevated 08/10/2020. -Lifestyle modifications. -For continued elevation - Plan: Lipid panel  Encounter for hepatitis C screening test for low risk patient  - Plan: Hep C Antibody  Anxiety -PHQ-9 score 4 -GAD-7 score 0 -Continue Celexa 40 mg daily -Consider weaning down is doing well. -Continue to monitor - Plan: citalopram (CELEXA) 40 MG tablet  Prediabetes -Hemoglobin A1c 6.0% on 08/10/2020 -Recheck hemoglobin A1c this visit -Lifestyle modification strongly encouraged  Encounter for surveillance of contraceptive pills -Nortrel 7/7/7 refilled  F/u in 4-6 months  Grier Mitts, MD

## 2021-08-23 ENCOUNTER — Other Ambulatory Visit: Payer: Self-pay | Admitting: Family Medicine

## 2021-08-23 DIAGNOSIS — E559 Vitamin D deficiency, unspecified: Secondary | ICD-10-CM

## 2021-08-23 MED ORDER — VITAMIN D (ERGOCALCIFEROL) 1.25 MG (50000 UNIT) PO CAPS
50000.0000 [IU] | ORAL_CAPSULE | ORAL | 0 refills | Status: DC
Start: 2021-08-23 — End: 2023-09-02

## 2021-08-26 ENCOUNTER — Encounter: Payer: 59 | Admitting: Physical Therapy

## 2021-08-28 NOTE — Addendum Note (Signed)
Addended by: Anderson Malta on: 08/28/2021 04:50 PM   Modules accepted: Orders

## 2021-08-29 ENCOUNTER — Encounter: Payer: 59 | Admitting: Physical Therapy

## 2021-08-29 ENCOUNTER — Ambulatory Visit: Payer: 59 | Admitting: Orthopaedic Surgery

## 2021-08-30 ENCOUNTER — Ambulatory Visit (INDEPENDENT_AMBULATORY_CARE_PROVIDER_SITE_OTHER): Payer: 59

## 2021-08-30 DIAGNOSIS — E538 Deficiency of other specified B group vitamins: Secondary | ICD-10-CM | POA: Diagnosis not present

## 2021-08-30 MED ORDER — CYANOCOBALAMIN 1000 MCG/ML IJ SOLN
1000.0000 ug | Freq: Once | INTRAMUSCULAR | Status: AC
  Administered 2021-08-30: 1000 ug via INTRAMUSCULAR

## 2021-08-30 NOTE — Progress Notes (Signed)
Per orders of Dr. Banks, injection of Cyanocobalamin 1000 mcg given by Breckan Cafiero L Jamyrah Saur. °Patient tolerated injection well.  °

## 2021-09-02 ENCOUNTER — Ambulatory Visit: Payer: 59 | Admitting: Physical Therapy

## 2021-09-02 ENCOUNTER — Other Ambulatory Visit: Payer: Self-pay

## 2021-09-02 ENCOUNTER — Encounter: Payer: Self-pay | Admitting: Physical Therapy

## 2021-09-02 ENCOUNTER — Telehealth: Payer: Self-pay | Admitting: Internal Medicine

## 2021-09-02 DIAGNOSIS — R2689 Other abnormalities of gait and mobility: Secondary | ICD-10-CM

## 2021-09-02 DIAGNOSIS — M25672 Stiffness of left ankle, not elsewhere classified: Secondary | ICD-10-CM | POA: Diagnosis not present

## 2021-09-02 DIAGNOSIS — M25572 Pain in left ankle and joints of left foot: Secondary | ICD-10-CM | POA: Diagnosis not present

## 2021-09-02 DIAGNOSIS — M6281 Muscle weakness (generalized): Secondary | ICD-10-CM | POA: Diagnosis not present

## 2021-09-02 DIAGNOSIS — R2681 Unsteadiness on feet: Secondary | ICD-10-CM

## 2021-09-02 NOTE — Telephone Encounter (Signed)
Pt called in requesting to sch appt from 11/9 referral. Pt is aware of appt date and time.

## 2021-09-02 NOTE — Therapy (Signed)
[facility] Physical Therapy [address] [place], [place], [address] Phone: [phone]   Fax:  [phone]  Physical Therapy Treatment Progress Note/ Recertification  Patient Details  Name: Erika Mullins MRN: [id number] Date of Birth: 04-05-1976 Referring Provider (PT): Bevely Palmer Persons, [place]   Encounter Date: 09/02/2021   PT End of Session - 09/02/21 0856     Visit Number 5    Number of Visits 14    Date for PT Re-Evaluation 10/04/21    Authorization Type UHC, Recerrt/PN sent on 09/02/2021 at 5th visit for 8 additional visits stariting 09/09/2021.    Authorization Time Period $25 copay, 60 visits, 1 visit prior to PT eval    Authorization - Visit Number 5    Authorization - Number of Visits 60    PT Start Time 0850    PT Stop Time 0928    PT Time Calculation (min) 38 min    Activity Tolerance Patient tolerated treatment well;No increased pain    Behavior During Therapy WFL for tasks assessed/performed             Past Medical History:  Diagnosis Date   Adult acne    Dysfunctional uterine bleeding    Obesity     Past Surgical History:  Procedure Laterality Date   CARPAL TUNNEL RELEASE  09/2010   Right   TONSILLECTOMY AND ADENOIDECTOMY     TYMPANOSTOMY TUBE PLACEMENT     bilateral    There were no vitals filed for this visit.   Subjective Assessment - 09/02/21 0854     Subjective Pt reporting 3/10 pain in her medial heel. 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    Pertinent History CTS with release, obesity    Patient Stated Goals To be pain free with activities    Currently in Pain? Yes    Pain Score 3     Pain Location Foot    Pain Orientation Left    Pain Descriptors / Indicators Aching;Sharp    Pain Type Acute pain    Pain Onset More than a month ago                [facility] PT Assessment - 09/02/21 0001       Assessment   Medical  Diagnosis M79.672,G89.29 (ICD-10-CM) - Heel pain, chronic, left    Referring Provider (PT) Bevely Palmer Persons, PA    Onset Date/Surgical Date 07/31/21   MD referral to PT   Hand Dominance Right      AROM   Right Ankle Dorsiflexion 10    Left Ankle Dorsiflexion 12    Left Ankle Inversion 28    Left Ankle Eversion 26      Strength   Strength Assessment Site Ankle    Right Ankle Plantar Flexion 5/5    Left Ankle Dorsiflexion 5/5    Left Ankle Plantar Flexion 4/5    Left Ankle Inversion 5/5    Left Ankle Eversion 5/5                           [facility] Adult PT Treatment/Exercise - 09/02/21 0001       Neuro Re-ed    Neuro Re-ed Details  SLS: left x 1 minute on      Exercises   Exercises Ankle      Manual Therapy  Manual Therapy Soft tissue mobilization    Manual therapy comments IASTM to achilles, plantar fascia, peroneals and medial ankle of left foot      Ankle Exercises: Stretches   Soleus Stretch 20 seconds;Limitations;2 reps    Soleus Stretch Limitations slant board    Gastroc Stretch 2 reps;20 seconds    Gastroc Stretch Limitations slant board      Ankle Exercises: Machines for Strengthening   Cybex Leg Press 2x10 heel raises 100#, leg press x20 125#      Ankle Exercises: Standing   BAPS Limitations Level 2 left LE x 1 minute each direction                          PT Long Term Goals - 09/02/21 0900       PT LONG TERM GOAL #1   Title FOTO >/= 72%    Status On-going    Target Date 10/04/21      PT LONG TERM GOAL #2   Title Pt reports left ankle / foot pain < 2/10 with standing & gait activites.    Baseline pain increased to 8/10 after prolonged standing at work over the weekend.    Status On-going    Target Date 10/04/21      PT LONG TERM GOAL #3   Title Left ankle AROM within 5* of RLE    Baseline continue    Status --      PT LONG TERM GOAL #4   Title Gait velocity fast pace >5.5 ft/sec without increase in pain     Baseline 5.0 feet/ second with pain in left ankle with push off    Status On-going    Target Date 10/04/21      PT LONG TERM GOAL #5   Baseline Step Climbing Power (watts) = body wt (kg) X gravity (9.8 m/s2) X step ht (m) X #steps  /  time (sec)    Status On-going    Target Date 10/04/21                   Plan - 09/02/21 0856     Clinical Impression Statement Pt reporting less pain today, but still reproting after prolonged standing on Saturday her pain increased to 8/10. Pt continuing to make progress with funcitonal mobility at home per report. Pt has progressed with pf strength and AROM since initial evaluation although still working on her meeting her LTG's. Continue skilled PT to focus on strrength and ROM in order for pt to tolerate walking and standing prolonged to return to her work related events. I am requesting 4 more weeks of therapy to progress toward pt's PLOF 2x week.    Examination-Activity Limitations Locomotion Level;Squat;Stairs;Stand    Stability/Clinical Decision Making Stable/Uncomplicated    Rehab Potential Good    PT Frequency 2x / week    PT Duration 4 weeks    PT Treatment/Interventions ADLs/Self Care Home Management;Cryotherapy;Electrical Stimulation;Moist Heat;Ultrasound;DME Instruction;Gait training;Stair training;Functional mobility training;Therapeutic activities;Therapeutic exercise;Balance training;Neuromuscular re-education;Patient/family education;Orthotic Fit/Training;Manual techniques;Taping;Joint Manipulations    PT Next Visit Plan IASTM, steps, SLS, dynamic balance    PT Home Exercise Plan Access Code: [email or web address]    Consulted and Agree with Plan of Care Patient             Patient will benefit from skilled therapeutic intervention in order to improve the following deficits and impairments:  Abnormal gait, Decreased balance, Decreased endurance, Decreased mobility, Decreased range of motion, Decreased  strength, Obesity, Pain  Visit  Diagnosis: Stiffness of left ankle, not elsewhere classified  Pain in left ankle and joints of left foot  Muscle weakness (generalized)  Other abnormalities of gait and mobility  Unsteadiness on feet     Problem List Patient Active Problem List   Diagnosis Date Noted   Contusion of right knee 09/20/2020   Prediabetes 08/14/2020   Mixed hyperlipidemia 08/10/2020   Seasonal allergies 08/10/2020   Pain in right shoulder 08/07/2020   Carpal tunnel syndrome, bilateral 08/07/2020   H/O carpal tunnel repair 11/04/2018   Routine general medical examination at a health care facility 09/12/2014   Anxiety 09/17/2012   Class 3 severe obesity due to excess calories with body mass index (BMI) of 50.0 to 59.9 in adult [facility]) 06/30/2011    Oretha Caprice, PT, MPT 09/02/2021, 9:51 AM  [facility] Physical Therapy [address] [place], [place], [address] Phone: [phone]   Fax:  [phone]  Name: KAMBER VIGNOLA MRN: [id number] Date of Birth: [birth year]

## 2021-09-05 ENCOUNTER — Ambulatory Visit: Payer: 59 | Admitting: Orthopaedic Surgery

## 2021-09-05 ENCOUNTER — Other Ambulatory Visit: Payer: Self-pay

## 2021-09-05 ENCOUNTER — Encounter: Payer: 59 | Admitting: Physical Therapy

## 2021-09-05 ENCOUNTER — Encounter: Payer: Self-pay | Admitting: Orthopaedic Surgery

## 2021-09-05 DIAGNOSIS — G8929 Other chronic pain: Secondary | ICD-10-CM | POA: Diagnosis not present

## 2021-09-05 DIAGNOSIS — M79672 Pain in left foot: Secondary | ICD-10-CM | POA: Diagnosis not present

## 2021-09-05 NOTE — Progress Notes (Signed)
Office Visit Note   Patient: Erika Mullins           Date of Birth: 05-Aug-1976           MRN: [id number] Visit Date: 09/05/2021              Requested by: Billie Ruddy, MD [place],  [place] [address] PCP: Billie Ruddy, MD   Assessment & Plan: Visit Diagnoses:  1. Heel pain, chronic, left     Plan: Ms. Conchas has been followed for the chronic left heel pain that is localized on the medial aspect of her heel.  She is wearing good comfortable shoe and has been going to physical therapy and feels like it is made a difference.  She also has a tight heel cord.  She walks without a limp and is not using any ambulatory aid.  She does have a thickened fat pad along the medial aspect of her heel and she will use a pumice stone she does experience some localized burning.  Neurologically intact.  We will finish her course of therapy and continue with her home exercises and and with a comfortable shoes we will plan to see her back as needed.  Symptoms are still consistent with plantar fasciitis  Follow-Up Instructions: Return if symptoms worsen or fail to improve.   Orders:  No orders of the defined types were placed in this encounter.  No orders of the defined types were placed in this encounter.     Procedures: No procedures performed   Clinical Data: No additional findings.   Subjective: Chief Complaint  Patient presents with   Left Foot - Follow-up  Patient presents today for follow up on her left heel pain. She has been going to physical therapy twice weekly. She states that she is noticing improvement. She does not take anything for pain because the pain is not consistent.  HPI  Review of Systems   Objective: Vital Signs: LMP 08/14/2021   Physical Exam Constitutional:      Appearance: She is well-developed.  Eyes:     Pupils: Pupils are equal, round, and reactive to light.  Pulmonary:     Effort: Pulmonary effort is normal.  Skin:     General: Skin is warm and dry.  Neurological:     Mental Status: She is alert and oriented to person, place, and time.  Psychiatric:        Behavior: Behavior normal.    Ortho Exam left foot with a thickened pad along the medial aspect of the os calcis.  She is experiencing some burning but otherwise sensation is intact.  Motor exam intact.  No ankle or midfoot pain.  She does pronate some and thus the reason for wearing the the good arch support shoes.  Specialty Comments:  No specialty comments available.  Imaging: No results found.   PMFS History: Patient Active Problem List   Diagnosis Date Noted   Heel pain, chronic, left 09/05/2021   Contusion of right knee 09/20/2020   Prediabetes 08/14/2020   Mixed hyperlipidemia 08/10/2020   Seasonal allergies 08/10/2020   Pain in right shoulder 08/07/2020   Carpal tunnel syndrome, bilateral 08/07/2020   H/O carpal tunnel repair 11/04/2018   Routine general medical examination at a health care facility 09/12/2014   Anxiety 09/17/2012   Class 3 severe obesity due to excess calories with body mass index (BMI) of 50.0 to 59.9 in adult [facility]) 06/30/2011   Past Medical  History:  Diagnosis Date   Adult acne    Dysfunctional uterine bleeding    Obesity     Family History  Problem Relation Age of Onset   Hyperlipidemia Father    Heart disease Father    Obesity Father    Cancer Maternal Grandfather        Pancreatic   Hyperlipidemia Maternal Grandfather    Heart attack Maternal Grandfather    Hyperlipidemia Other    Hypertension Other    Obesity Other    Stroke Maternal Grandmother    Breast cancer Neg Hx     Past Surgical History:  Procedure Laterality Date   CARPAL TUNNEL RELEASE  09/2010   Right   TONSILLECTOMY AND ADENOIDECTOMY     TYMPANOSTOMY TUBE PLACEMENT     bilateral   Social History   Occupational History   Not on file  Tobacco Use   Smoking status: Never   Smokeless tobacco: Never  Substance and Sexual  Activity   Alcohol use: Yes    Comment: large glass of wine 3-4 nights/week   Drug use: No   Sexual activity: Not on file

## 2021-09-09 ENCOUNTER — Encounter: Payer: 59 | Admitting: Rehabilitative and Restorative Service Providers"

## 2021-09-16 ENCOUNTER — Other Ambulatory Visit: Payer: Self-pay | Admitting: Physician Assistant

## 2021-09-16 ENCOUNTER — Other Ambulatory Visit: Payer: Self-pay

## 2021-09-16 ENCOUNTER — Ambulatory Visit: Payer: 59 | Admitting: Physical Therapy

## 2021-09-16 ENCOUNTER — Encounter: Payer: Self-pay | Admitting: Physical Therapy

## 2021-09-16 DIAGNOSIS — M25672 Stiffness of left ankle, not elsewhere classified: Secondary | ICD-10-CM | POA: Diagnosis not present

## 2021-09-16 DIAGNOSIS — R2689 Other abnormalities of gait and mobility: Secondary | ICD-10-CM

## 2021-09-16 DIAGNOSIS — D75839 Thrombocytosis, unspecified: Secondary | ICD-10-CM

## 2021-09-16 DIAGNOSIS — M6281 Muscle weakness (generalized): Secondary | ICD-10-CM | POA: Diagnosis not present

## 2021-09-16 DIAGNOSIS — M25572 Pain in left ankle and joints of left foot: Secondary | ICD-10-CM

## 2021-09-16 DIAGNOSIS — R2681 Unsteadiness on feet: Secondary | ICD-10-CM

## 2021-09-16 NOTE — Therapy (Signed)
[facility] [address] [place], [place], [address] Phone: [phone]   Fax:  [phone]  Physical Therapy Treatment  Patient Details  Name: Erika Mullins MRN: [id number] Date of Birth: [birth year] Referring Provider (PT): Bevely Palmer Persons, [place]   Encounter Date: 09/16/2021   PT End of Session - 09/16/21 1554     Visit Number 6    Number of Visits 14    Date for PT Re-Evaluation 10/04/21    Authorization Type UHC, Recerrt/PN sent on 09/02/2021 at 5th visit for 8 additional visits stariting 09/09/2021.    Authorization Time Period $25 copay, 60 visits, 1 visit prior to PT eval    Authorization - Visit Number 6    Authorization - Number of Visits 60    PT Start Time 4259    PT Stop Time 1553    PT Time Calculation (min) 38 min    Activity Tolerance Patient tolerated treatment well;No increased pain    Behavior During Therapy WFL for tasks assessed/performed             Past Medical History:  Diagnosis Date   Adult acne    Dysfunctional uterine bleeding    Obesity     Past Surgical History:  Procedure Laterality Date   CARPAL TUNNEL RELEASE  09/2010   Right   TONSILLECTOMY AND ADENOIDECTOMY     TYMPANOSTOMY TUBE PLACEMENT     bilateral    There were no vitals filed for this visit.   Subjective Assessment - 09/16/21 1517     Subjective pain has been intermittent today, up to 7/10    Pertinent History CTS with release, obesity    Patient Stated Goals To be pain free with activities    Currently in Pain? Yes    Pain Score 0-No pain   up to 7/10   Pain Location Foot    Pain Orientation Left    Pain Descriptors / Indicators Aching;Sharp    Pain Type Acute pain    Pain Onset More than a month ago    Pain Frequency Intermittent    Aggravating Factors  weightbearing    Pain Relieving Factors rest                               [facility] Adult PT Treatment/Exercise - 09/16/21 1518       Manual  Therapy   Manual therapy comments IASTM with compression to achilles, plantar fascia, peroneals and medial ankle of left foot      Ankle Exercises: Aerobic   Nustep L6 x 6 min      Ankle Exercises: Stretches   Soleus Stretch 3 reps;30 seconds    Soleus Stretch Limitations slant board    Gastroc Stretch 3 reps;30 seconds    Gastroc Stretch Limitations slant board      Ankle Exercises: Standing   Heel Raises Both;20 reps    Heel Raises Limitations slow eccentrics; on slantboard              Trigger Point Dry Needling - 09/16/21 1553     Consent Given? Yes    Education Handout Provided Yes    Muscles Treated Lower Quadrant Soleus;Quadratus plantae    Quadatus plantae response Twitch response elicited    Soleus Response Twitch response elicited                        PT Long  Term Goals - 09/02/21 0900       PT LONG TERM GOAL #1   Title FOTO >/= 72%    Status On-going    Target Date 10/04/21      PT LONG TERM GOAL #2   Title Pt reports left ankle / foot pain < 2/10 with standing & gait activites.    Baseline pain increased to 8/10 after prolonged standing at work over the weekend.    Status On-going    Target Date 10/04/21      PT LONG TERM GOAL #3   Title Left ankle AROM within 5* of RLE    Baseline continue    Status --      PT LONG TERM GOAL #4   Title Gait velocity fast pace >5.5 ft/sec without increase in pain    Baseline 5.0 feet/ second with pain in left ankle with push off    Status On-going    Target Date 10/04/21      PT LONG TERM GOAL #5   Baseline Step Climbing Power (watts) = body wt (kg) X gravity (9.8 m/s2) X step ht (m) X #steps  /  time (sec)    Status On-going    Target Date 10/04/21                   Plan - 09/16/21 1554     Clinical Impression Statement Trial of DN with manual therapy today to see if this helps with reducing pain.  Will continue to benefit from PT to maximize function.    Examination-Activity  Limitations Locomotion Level;Squat;Stairs;Stand    Stability/Clinical Decision Making Stable/Uncomplicated    Rehab Potential Good    PT Frequency 2x / week    PT Duration 4 weeks    PT Treatment/Interventions ADLs/Self Care Home Management;Cryotherapy;Electrical Stimulation;Moist Heat;Ultrasound;DME Instruction;Gait training;Stair training;Functional mobility training;Therapeutic activities;Therapeutic exercise;Balance training;Neuromuscular re-education;Patient/family education;Orthotic Fit/Training;Manual techniques;Taping;Joint Manipulations    PT Next Visit Plan IASTM, steps, SLS, dynamic balance, assess response to DN    PT Home Exercise Plan Access Code: [email or web address]    Consulted and Agree with Plan of Care Patient             Patient will benefit from skilled therapeutic intervention in order to improve the following deficits and impairments:  Abnormal gait, Decreased balance, Decreased endurance, Decreased mobility, Decreased range of motion, Decreased strength, Obesity, Pain  Visit Diagnosis: Stiffness of left ankle, not elsewhere classified  Pain in left ankle and joints of left foot  Muscle weakness (generalized)  Other abnormalities of gait and mobility  Unsteadiness on feet     Problem List Patient Active Problem List   Diagnosis Date Noted   Heel pain, chronic, left 09/05/2021   Contusion of right knee 09/20/2020   Prediabetes 08/14/2020   Mixed hyperlipidemia 08/10/2020   Seasonal allergies 08/10/2020   Pain in right shoulder 08/07/2020   Carpal tunnel syndrome, bilateral 08/07/2020   H/O carpal tunnel repair 11/04/2018   Routine general medical examination at a health care facility 09/12/2014   Anxiety 09/17/2012   Class 3 severe obesity due to excess calories with body mass index (BMI) of 50.0 to 59.9 in adult [facility]) 06/30/2011      Laureen Abrahams, PT, DPT 09/16/21 3:57 PM     [facility] [address] [place], [place], [address] Phone: [phone]   Fax:  [phone]  Name: CHARDAI GANGEMI MRN: [id number] Date of Birth: 25-Feb-1976

## 2021-09-17 ENCOUNTER — Inpatient Hospital Stay: Payer: 59

## 2021-09-17 ENCOUNTER — Inpatient Hospital Stay: Payer: 59 | Attending: Internal Medicine | Admitting: Internal Medicine

## 2021-09-17 DIAGNOSIS — D75839 Thrombocytosis, unspecified: Secondary | ICD-10-CM | POA: Insufficient documentation

## 2021-09-17 DIAGNOSIS — E559 Vitamin D deficiency, unspecified: Secondary | ICD-10-CM | POA: Diagnosis not present

## 2021-09-17 DIAGNOSIS — E538 Deficiency of other specified B group vitamins: Secondary | ICD-10-CM | POA: Diagnosis not present

## 2021-09-17 DIAGNOSIS — Z800 Family history of malignant neoplasm of digestive organs: Secondary | ICD-10-CM

## 2021-09-17 DIAGNOSIS — D72829 Elevated white blood cell count, unspecified: Secondary | ICD-10-CM | POA: Insufficient documentation

## 2021-09-17 DIAGNOSIS — D75838 Other thrombocytosis: Secondary | ICD-10-CM | POA: Insufficient documentation

## 2021-09-17 LAB — IRON AND TIBC
Iron: 117 ug/dL (ref 28–170)
Saturation Ratios: 31 % (ref 10.4–31.8)
TIBC: 378 ug/dL (ref 250–450)
UIBC: 261 ug/dL

## 2021-09-17 LAB — CBC WITH DIFFERENTIAL (CANCER CENTER ONLY)
Abs Immature Granulocytes: 0.03 10*3/uL (ref 0.00–0.07)
Basophils Absolute: 0.1 10*3/uL (ref 0.0–0.1)
Basophils Relative: 1 %
Eosinophils Absolute: 0.3 10*3/uL (ref 0.0–0.5)
Eosinophils Relative: 2 %
HCT: 41.4 % (ref 36.0–46.0)
Hemoglobin: 13.8 g/dL (ref 12.0–15.0)
Immature Granulocytes: 0 %
Lymphocytes Relative: 29 %
Lymphs Abs: 3.8 10*3/uL (ref 0.7–4.0)
MCH: 28.5 pg (ref 26.0–34.0)
MCHC: 33.3 g/dL (ref 30.0–36.0)
MCV: 85.4 fL (ref 80.0–100.0)
Monocytes Absolute: 0.9 10*3/uL (ref 0.1–1.0)
Monocytes Relative: 7 %
Neutro Abs: 8 10*3/uL — ABNORMAL HIGH (ref 1.7–7.7)
Neutrophils Relative %: 61 %
Platelet Count: 513 10*3/uL — ABNORMAL HIGH (ref 150–400)
RBC: 4.85 MIL/uL (ref 3.87–5.11)
RDW: 12.7 % (ref 11.5–15.5)
WBC Count: 13.1 10*3/uL — ABNORMAL HIGH (ref 4.0–10.5)
nRBC: 0 % (ref 0.0–0.2)

## 2021-09-17 LAB — CMP (CANCER CENTER ONLY)
ALT: 12 U/L (ref 0–44)
AST: 16 U/L (ref 15–41)
Albumin: 3.8 g/dL (ref 3.5–5.0)
Alkaline Phosphatase: 86 U/L (ref 38–126)
Anion gap: 8 (ref 5–15)
BUN: 10 mg/dL (ref 6–20)
CO2: 24 mmol/L (ref 22–32)
Calcium: 9.3 mg/dL (ref 8.9–10.3)
Chloride: 104 mmol/L (ref 98–111)
Creatinine: 0.8 mg/dL (ref 0.44–1.00)
GFR, Estimated: >60 mL/min (ref >60)
Glucose, Bld: 118 mg/dL — ABNORMAL HIGH (ref 70–99)
Potassium: 3.8 mmol/L (ref 3.5–5.1)
Sodium: 136 mmol/L (ref 135–145)
Total Bilirubin: 0.4 mg/dL (ref 0.3–1.2)
Total Protein: 7.1 g/dL (ref 6.5–8.1)

## 2021-09-17 LAB — FERRITIN: Ferritin: 25 ng/mL (ref 11–307)

## 2021-09-17 LAB — LACTATE DEHYDROGENASE: LDH: 147 U/L (ref 98–192)

## 2021-09-17 NOTE — Progress Notes (Signed)
[place] [phone]   [phone]  CONSULT NOTE  REFERRING PHYSICIAN: Dr. Grier Mitts  REASON FOR CONSULTATION:  45 years old white female with thrombocytosis  HPI Erika Mullins is a 45 y.o. female with past medical history significant for dysfunctional uterine bleed as well as moderate obesity.  The patient was seen by her primary care physician for routine follow-up visit and annual exam.  During her evaluation she had blood work done that showed low level of vitamin B12 as well as vitamin D deficiency.  She was also noted on her CBC to have mild leukocytosis as well as thrombocytosis.  The patient was referred to me today for evaluation and recommendation regarding this condition. She has no complaints except for fatigue.  She denied having any current chest pain, shortness of breath, cough or hemoptysis.  She denied having any fever or chills.  She has no nausea, vomiting, diarrhea or constipation.  She has no headache or visual changes.  She has a history of dysfunctional uterine bleeding and she has been on oral contraceptive pills for the last few years. Family history significant for father with heart disease and dyslipidemia.  Maternal grandfather had pancreatic cancer and mother had dyslipidemia. The patient is single and has no children.  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  She has no history of smoking but drinks alcohol occasionally and no history of drug abuse.  HPI  Past Medical History:  Diagnosis Date   Adult acne    Dysfunctional uterine bleeding    Obesity     Past Surgical History:  Procedure Laterality Date   CARPAL TUNNEL RELEASE  09/2010   Right   TONSILLECTOMY AND ADENOIDECTOMY     TYMPANOSTOMY TUBE PLACEMENT     bilateral    Family History  Problem Relation Age of Onset   Hyperlipidemia Father    Heart disease Father    Obesity Father    Cancer Maternal Grandfather         Pancreatic   Hyperlipidemia Maternal Grandfather    Heart attack Maternal Grandfather    Hyperlipidemia Other    Hypertension Other    Obesity Other    Stroke Maternal Grandmother    Breast cancer Neg Hx     Social History Social History   Tobacco Use   Smoking status: Never   Smokeless tobacco: Never  Substance Use Topics   Alcohol use: Yes    Comment: large glass of wine 3-4 nights/week   Drug use: No    Allergies  Allergen Reactions   Doxycycline Hives   Nickel Hives    Current Outpatient Medications  Medication Sig Dispense Refill   cetirizine (ZYRTEC) 10 MG tablet Take 1 tablet (10 mg total) by mouth daily. 100 tablet 4   citalopram (CELEXA) 40 MG tablet Take 1 tablet (40 mg total) by mouth daily. 90 tablet 1   Multiple Vitamin (MULTIVITAMIN WITH MINERALS) TABS Take 1 tablet by mouth every morning.     norethindrone-ethinyl estradiol (NORTREL 7/7/7) 0.5/0.75/1-35 MG-MCG tablet Take 1 tablet by mouth daily. 84 tablet 3   Omega-3 Fatty Acids (FISH OIL) 1000 MG CAPS Take 1 capsule by mouth daily.     Vitamin D, Ergocalciferol, (DRISDOL) 1.25 MG (50000 UNIT) CAPS capsule Take 1 capsule (50,000 Units total) by mouth every 7 (seven) days. 12 capsule 0   ibuprofen (ADVIL,MOTRIN) 200 MG tablet Take 600-800 mg by mouth every 6 (six) hours as  needed for pain. (Patient not taking: Reported on 09/17/2021)     loperamide (IMODIUM) 2 MG capsule Take 2 mg by mouth 2 (two) times daily as needed for diarrhea or loose stools. (Patient not taking: Reported on 09/17/2021)     No current facility-administered medications for this visit.    Review of Systems  Constitutional: positive for fatigue Eyes: negative Ears, nose, mouth, throat, and face: negative Respiratory: negative Cardiovascular: negative Gastrointestinal: negative Genitourinary:negative Integument/breast: negative Hematologic/lymphatic: negative Musculoskeletal:negative Neurological: negative Behavioral/Psych:  negative Endocrine: negative Allergic/Immunologic: negative  Physical Exam  BHA:LPFXT, healthy, no distress, well nourished, and well developed SKIN: skin color, texture, turgor are normal, no rashes or significant lesions HEAD: Normocephalic, No masses, lesions, tenderness or abnormalities EYES: normal, PERRLA, Conjunctiva are pink and non-injected EARS: External ears normal, Canals clear OROPHARYNX:no exudate, no erythema, and lips, buccal mucosa, and tongue normal  NECK: supple, no adenopathy, no JVD LYMPH:  no palpable lymphadenopathy, no hepatosplenomegaly BREAST:not examined LUNGS: clear to auscultation , and palpation HEART: regular rate & rhythm, no murmurs, and no gallops ABDOMEN:abdomen soft, non-tender, normal bowel sounds, and no masses or organomegaly BACK: Back symmetric, no curvature., No CVA tenderness EXTREMITIES:no joint deformities, effusion, or inflammation, no edema  NEURO: alert & oriented x 3 with fluent speech, no focal motor/sensory deficits  PERFORMANCE STATUS: ECOG 0  LABORATORY DATA: Lab Results  Component Value Date   WBC 13.1 (H) 09/17/2021   HGB 13.8 09/17/2021   HCT 41.4 09/17/2021   MCV 85.4 09/17/2021   PLT 513 (H) 09/17/2021      Chemistry      Component Value Date/Time   NA 136 09/17/2021 1115   K 3.8 09/17/2021 1115   CL 104 09/17/2021 1115   CO2 24 09/17/2021 1115   BUN 10 09/17/2021 1115   CREATININE 0.80 09/17/2021 1115   CREATININE 0.71 08/10/2020 0942      Component Value Date/Time   CALCIUM 9.3 09/17/2021 1115   ALKPHOS 86 09/17/2021 1115   AST 16 09/17/2021 1115   ALT 12 09/17/2021 1115   BILITOT 0.4 09/17/2021 1115       RADIOGRAPHIC STUDIES: No results found.  ASSESSMENT: This is a very pleasant 45 years old white female presented for evaluation of mild thrombocytosis and leukocytosis most likely reactive in nature secondary to her hormonal therapy with oral contraceptive pills or any other underlying inflammatory  process.   PLAN: I had a lengthy discussion with the patient today about her condition and further investigation to confirm her diagnosis and to rule out the presence of any underlying bone marrow abnormalities like essential thrombocythemia or other myeloproliferative disorder. I order several studies including repeat CBC which showed the persistent mild leukocytosis and thrombocytosis in addition to iron study, ferritin, JAK2 mutation, LDH as well as comprehensive metabolic panel. If these lab works are unremarkable, the patient will continue her routine follow-up visit and evaluation by her primary care physician.  We will be happy to see her if there is any concerning abnormalities in the future. For the vitamin B12 deficiency, she will continue with the monthly vitamin B12 injection as prescribed by her primary care physician. The patient was advised to call if she has any other concerning symptoms in the interval. The patient voices understanding of current disease status and treatment options and is in agreement with the current care plan.  All questions were answered. The patient knows to call the clinic with any problems, questions or concerns. We can certainly see the  patient much sooner if necessary.  Thank you so much for allowing me to participate in the care of Erika Mullins. I will continue to follow up the patient with you and assist in her care.  The total time spent in the appointment was 60 minutes.  Disclaimer: This note was dictated with voice recognition software. Similar sounding words can inadvertently be transcribed and may not be corrected upon review.   Eilleen Kempf September 17, 2021, 12:18 PM

## 2021-09-18 ENCOUNTER — Encounter: Payer: Self-pay | Admitting: Physical Therapy

## 2021-09-18 ENCOUNTER — Ambulatory Visit: Payer: 59 | Admitting: Physical Therapy

## 2021-09-18 ENCOUNTER — Other Ambulatory Visit: Payer: Self-pay

## 2021-09-18 DIAGNOSIS — M25572 Pain in left ankle and joints of left foot: Secondary | ICD-10-CM | POA: Diagnosis not present

## 2021-09-18 DIAGNOSIS — R2681 Unsteadiness on feet: Secondary | ICD-10-CM

## 2021-09-18 DIAGNOSIS — M25672 Stiffness of left ankle, not elsewhere classified: Secondary | ICD-10-CM

## 2021-09-18 DIAGNOSIS — M25511 Pain in right shoulder: Secondary | ICD-10-CM

## 2021-09-18 DIAGNOSIS — R2689 Other abnormalities of gait and mobility: Secondary | ICD-10-CM

## 2021-09-18 DIAGNOSIS — M6281 Muscle weakness (generalized): Secondary | ICD-10-CM | POA: Diagnosis not present

## 2021-09-18 DIAGNOSIS — G8929 Other chronic pain: Secondary | ICD-10-CM

## 2021-09-18 NOTE — Therapy (Signed)
[facility] [address] [place], [place], [address] Phone: [phone]   Fax:  [phone]  Physical Therapy Treatment  Patient Details  Name: Erika Mullins MRN: [id number] Date of Birth: [birth year] Referring Provider (PT): Bevely Palmer Persons, [place]   Encounter Date: 09/18/2021   PT End of Session - 09/18/21 0849     Visit Number 7    Number of Visits 14    Date for PT Re-Evaluation 10/04/21    Authorization Type UHC, Recerrt/PN sent on 09/02/2021 at 5th visit for 8 additional visits stariting 09/09/2021.    Authorization - Visit Number 7    Authorization - Number of Visits 60    PT Start Time 4944    PT Stop Time 0932    PT Time Calculation (min) 45 min    Activity Tolerance Patient tolerated treatment well;No increased pain    Behavior During Therapy WFL for tasks assessed/performed             Past Medical History:  Diagnosis Date   Adult acne    Dysfunctional uterine bleeding    Obesity     Past Surgical History:  Procedure Laterality Date   CARPAL TUNNEL RELEASE  09/2010   Right   TONSILLECTOMY AND ADENOIDECTOMY     TYMPANOSTOMY TUBE PLACEMENT     bilateral    There were no vitals filed for this visit.   Subjective Assessment - 09/18/21 0850     Subjective pt reporting no pain presently but awoke with 8/10 pain before even standing up. I think the needling helped. My calf is sore.    Patient Stated Goals To be pain free with activities    Currently in Pain? No/denies                               [facility] Adult PT Treatment/Exercise - 09/18/21 0001       Self-Care   Other Self-Care Comments  mfr with ball x 2 min      Exercises   Exercises Ankle      Manual Therapy   Manual Therapy Myofascial release    Manual therapy comments IASTM with compression to plantar fascia, peroneals and medial left foot    Myofascial Release deep to plantar fascia      Ankle Exercises: Stretches   Soleus  Stretch 3 reps;30 seconds    Soleus Stretch Limitations slant board    Gastroc Stretch 3 reps;30 seconds    Gastroc Stretch Limitations slant board      Ankle Exercises: Aerobic   Stationary Bike L4 x 5 min      Ankle Exercises: Standing   Heel Raises Right;Left;3 seconds    Heel Raises Limitations temp: up,2,3; hold 2,3; down 2,3 3x10 rest during other side    Other Standing Ankle Exercises arch strengthenging through big toe and heel 5 sec hold 2x10                     PT Education - 09/18/21 1148     Education Details HEP progressed and self MFR    Person(s) Educated Patient    Methods Explanation;Demonstration;Verbal cues;Handout    Comprehension Returned demonstration;Verbalized understanding                 PT Long Term Goals - 09/02/21 0900       PT LONG TERM GOAL #1   Title FOTO >/= 72%  Status On-going    Target Date 10/04/21      PT LONG TERM GOAL #2   Title Pt reports left ankle / foot pain < 2/10 with standing & gait activites.    Baseline pain increased to 8/10 after prolonged standing at work over the weekend.    Status On-going    Target Date 10/04/21      PT LONG TERM GOAL #3   Title Left ankle AROM within 5* of RLE    Baseline continue    Status --      PT LONG TERM GOAL #4   Title Gait velocity fast pace >5.5 ft/sec without increase in pain    Baseline 5.0 feet/ second with pain in left ankle with push off    Status On-going    Target Date 10/04/21      PT LONG TERM GOAL #5   Baseline Step Climbing Power (watts) = body wt (kg) X gravity (9.8 m/s2) X step ht (m) X #steps  /  time (sec)    Status On-going    Target Date 10/04/21                   Plan - 09/18/21 1149     Clinical Impression Statement Patinet reports positive response to DN, but continued to have increased pain this morning. she has decreased strength with unilateral heel raises vs. right. Still very tight with MFR in plantar fascia but tolerated  deep manual therapy well. Added arch strengthening to HEP and MFR.    PT Treatment/Interventions ADLs/Self Care Home Management;Cryotherapy;Electrical Stimulation;Moist Heat;Ultrasound;DME Instruction;Gait training;Stair training;Functional mobility training;Therapeutic activities;Therapeutic exercise;Balance training;Neuromuscular re-education;Patient/family education;Orthotic Fit/Training;Manual techniques;Taping;Joint Manipulations    PT Next Visit Plan IASTM, steps, SLS, dynamic balance             Patient will benefit from skilled therapeutic intervention in order to improve the following deficits and impairments:  Abnormal gait, Decreased balance, Decreased endurance, Decreased mobility, Decreased range of motion, Decreased strength, Obesity, Pain  Visit Diagnosis: Stiffness of left ankle, not elsewhere classified  Pain in left ankle and joints of left foot  Muscle weakness (generalized)  Other abnormalities of gait and mobility  Unsteadiness on feet  Chronic right shoulder pain     Problem List Patient Active Problem List   Diagnosis Date Noted   Reactive thrombocytosis 09/17/2021   Heel pain, chronic, left 09/05/2021   Contusion of right knee 09/20/2020   Prediabetes 08/14/2020   Mixed hyperlipidemia 08/10/2020   Seasonal allergies 08/10/2020   Pain in right shoulder 08/07/2020   Carpal tunnel syndrome, bilateral 08/07/2020   H/O carpal tunnel repair 11/04/2018   Routine general medical examination at a health care facility 09/12/2014   Anxiety 09/17/2012   Class 3 severe obesity due to excess calories with body mass index (BMI) of 50.0 to 59.9 in adult [facility]) 06/30/2011   Madelyn Flavors, PT 09/18/2021, 11:55 AM  [facility] [address] [place], [place], [address] Phone: [phone]   Fax:  [phone]  Name: Erika Mullins MRN: [id number] Date of Birth: Aug 30, 1976

## 2021-09-18 NOTE — Patient Instructions (Signed)
Access Code: [email or web address] URL: [email or web address] Date: 09/18/2021 Prepared by: Almyra Free  Exercises Standing Bilateral Gastroc Stretch with Step - 1 x daily - 7 x weekly - 1 sets - 2-3 reps - 30 seconds hold Standing Soleus Stretch on Step with Counter Support - 1 x daily - 7 x weekly - 1 sets - 2-3 reps - 30 seconds hold Standing Bilateral Heel Raise on Step - 1 x daily - 7 x weekly - 3 sets - 10 reps - 5 seconds hold Standing Eccentric Heel Raise - 1 x daily - 7 x weekly - 3 sets - 10 reps - 5 seconds hold Standing Single Leg Ball Rolls - Forward Backward - 1 x daily - 7 x weekly - 2 sets - 15 reps Standing Single Leg Ball Rolls - Side to Side - 1 x daily - 7 x weekly - 2 sets - 15 reps Standing Single Leg Ball Rolling in Circles - 1 x daily - 7 x weekly - 2 sets - 15 reps Slant Board Gastrocnemius Stretch - 2-3 x daily - 7 x weekly - 1 sets - 3 reps - 60 seconds hold Slant Board Soleus Stretch - 2-3 x daily - 7 x weekly - 1 sets - 3 reps - 60 seconds hold Arch Lifting - 2 x daily - 7 x weekly - 1 sets - 10 reps - 5 sec hold Seated Plantar Fascia Mobilization with Small Ball - 1 x daily - 7 x weekly - 3 sets - 10 reps

## 2021-09-23 ENCOUNTER — Other Ambulatory Visit: Payer: Self-pay

## 2021-09-23 ENCOUNTER — Encounter: Payer: Self-pay | Admitting: Physical Therapy

## 2021-09-23 ENCOUNTER — Ambulatory Visit: Payer: 59 | Admitting: Physical Therapy

## 2021-09-23 DIAGNOSIS — R2681 Unsteadiness on feet: Secondary | ICD-10-CM

## 2021-09-23 DIAGNOSIS — M6281 Muscle weakness (generalized): Secondary | ICD-10-CM | POA: Diagnosis not present

## 2021-09-23 DIAGNOSIS — M25672 Stiffness of left ankle, not elsewhere classified: Secondary | ICD-10-CM | POA: Diagnosis not present

## 2021-09-23 DIAGNOSIS — M25572 Pain in left ankle and joints of left foot: Secondary | ICD-10-CM

## 2021-09-23 DIAGNOSIS — R2689 Other abnormalities of gait and mobility: Secondary | ICD-10-CM

## 2021-09-23 LAB — JAK2 (INCLUDING V617F AND EXON 12), MPL,& CALR W/RFL MPN PANEL (NGS)

## 2021-09-23 NOTE — Therapy (Signed)
[facility] [address] [place], [place], [address] Phone: [phone]   Fax:  [phone]  Physical Therapy Treatment  Patient Details  Name: Erika Mullins MRN: [id number] Date of Birth: [birth year] Referring Provider (PT): Bevely Palmer Persons, [place]   Encounter Date: 09/23/2021   PT End of Session - 09/23/21 1514     Visit Number 8    Number of Visits 14    Date for PT Re-Evaluation 10/04/21    Authorization Type UHC, Recerrt/PN sent on 09/02/2021 at 5th visit for 8 additional visits stariting 09/09/2021.    Authorization - Visit Number 8    Authorization - Number of Visits 60    PT Start Time 1430    PT Stop Time 1510    PT Time Calculation (min) 40 min    Activity Tolerance Patient tolerated treatment well;No increased pain    Behavior During Therapy WFL for tasks assessed/performed             Past Medical History:  Diagnosis Date   Adult acne    Dysfunctional uterine bleeding    Obesity     Past Surgical History:  Procedure Laterality Date   CARPAL TUNNEL RELEASE  09/2010   Right   TONSILLECTOMY AND ADENOIDECTOMY     TYMPANOSTOMY TUBE PLACEMENT     bilateral    There were no vitals filed for this visit.   Subjective Assessment - 09/23/21 1434     Subjective foot is doing "really well today."  had a little pain on saturday    Patient Stated Goals To be pain free with activities    Currently in Pain? No/denies                          [facility] Adult PT Treatment/Exercise - 09/23/21 1432       Ankle Exercises: Aerobic   Stationary Bike L4 x 5 min      Ankle Exercises: Stretches   Soleus Stretch 3 reps;30 seconds    Soleus Stretch Limitations slant board    Gastroc Stretch 3 reps;30 seconds    Gastroc Stretch Limitations slant board    Other Stretch gastroc and soleus stretch x 30 sec after strengthening with standing at counter      Ankle Exercises: Standing   Heel Raises Both;20 reps    Heel  Raises Limitations off 4" step    Other Standing Ankle Exercises arch strengthenging through big toe and heel 5 sec hold 2x10      Ankle Exercises: Seated   Heel Raises Left;20 reps;3 seconds      Ankle Exercises: Supine   T-Band seated Lt ankle 4-way x 20 reps; L4 band                          PT Long Term Goals - 09/02/21 0900       PT LONG TERM GOAL #1   Title FOTO >/= 72%    Status On-going    Target Date 10/04/21      PT LONG TERM GOAL #2   Title Pt reports left ankle / foot pain < 2/10 with standing & gait activites.    Baseline pain increased to 8/10 after prolonged standing at work over the weekend.    Status On-going    Target Date 10/04/21      PT LONG TERM GOAL #3   Title Left ankle AROM within 5* of  RLE    Baseline continue    Status --      PT LONG TERM GOAL #4   Title Gait velocity fast pace >5.5 ft/sec without increase in pain    Baseline 5.0 feet/ second with pain in left ankle with push off    Status On-going    Target Date 10/04/21      PT LONG TERM GOAL #5   Baseline Step Climbing Power (watts) = body wt (kg) X gravity (9.8 m/s2) X step ht (m) X #steps  /  time (sec)    Status On-going    Target Date 10/04/21                   Plan - 09/23/21 1515     Clinical Impression Statement Pt reporting minimal episodes of pain since last session and no pain for a couple of days demonstrating positive response to PT to date.  Deferred DN and manual today as she has no pain and can reasses next visit.  Tolerated exercises well today without increase in pain.  Will continue to benefit from PT to maximize function.  Anticipate if pain continues to be decreased may be ready for d/c next 1-2 weeks.    PT Treatment/Interventions ADLs/Self Care Home Management;Cryotherapy;Electrical Stimulation;Moist Heat;Ultrasound;DME Instruction;Gait training;Stair training;Functional mobility training;Therapeutic activities;Therapeutic exercise;Balance  training;Neuromuscular re-education;Patient/family education;Orthotic Fit/Training;Manual techniques;Taping;Joint Manipulations    PT Next Visit Plan IASTM PRN, steps, foot/ankle strengthening    PT Home Exercise Plan Access Code: [email or web address]    Consulted and Agree with Plan of Care Patient             Patient will benefit from skilled therapeutic intervention in order to improve the following deficits and impairments:  Abnormal gait, Decreased balance, Decreased endurance, Decreased mobility, Decreased range of motion, Decreased strength, Obesity, Pain  Visit Diagnosis: Stiffness of left ankle, not elsewhere classified  Pain in left ankle and joints of left foot  Muscle weakness (generalized)  Other abnormalities of gait and mobility  Unsteadiness on feet     Problem List Patient Active Problem List   Diagnosis Date Noted   Reactive thrombocytosis 09/17/2021   Heel pain, chronic, left 09/05/2021   Contusion of right knee 09/20/2020   Prediabetes 08/14/2020   Mixed hyperlipidemia 08/10/2020   Seasonal allergies 08/10/2020   Pain in right shoulder 08/07/2020   Carpal tunnel syndrome, bilateral 08/07/2020   H/O carpal tunnel repair 11/04/2018   Routine general medical examination at a health care facility 09/12/2014   Anxiety 09/17/2012   Class 3 severe obesity due to excess calories with body mass index (BMI) of 50.0 to 59.9 in adult [facility]) 06/30/2011        Laureen Abrahams, PT, DPT 09/23/21 3:17 PM     [facility] [address] [place], [place], [address] Phone: [phone]   Fax:  [phone]  Name: SANNA PORCARO MRN: [id number] Date of Birth: 13-Feb-1976

## 2021-09-25 ENCOUNTER — Encounter: Payer: Self-pay | Admitting: Physical Therapy

## 2021-09-25 ENCOUNTER — Other Ambulatory Visit: Payer: Self-pay

## 2021-09-25 ENCOUNTER — Ambulatory Visit: Payer: 59 | Admitting: Physical Therapy

## 2021-09-25 DIAGNOSIS — M25572 Pain in left ankle and joints of left foot: Secondary | ICD-10-CM

## 2021-09-25 DIAGNOSIS — R2689 Other abnormalities of gait and mobility: Secondary | ICD-10-CM | POA: Diagnosis not present

## 2021-09-25 DIAGNOSIS — M25672 Stiffness of left ankle, not elsewhere classified: Secondary | ICD-10-CM | POA: Diagnosis not present

## 2021-09-25 DIAGNOSIS — M6281 Muscle weakness (generalized): Secondary | ICD-10-CM

## 2021-09-25 DIAGNOSIS — R2681 Unsteadiness on feet: Secondary | ICD-10-CM

## 2021-09-25 NOTE — Therapy (Signed)
[facility] [address] [place], [place], [address] Phone: [phone]   Fax:  [phone]  Physical Therapy Treatment  Patient Details  Name: Erika Mullins MRN: [id number] Date of Birth: [birth year] Referring Provider (PT): Bevely Palmer Persons, [place]   Encounter Date: 09/25/2021   PT End of Session - 09/25/21 0921     Visit Number 9    Number of Visits 14    Date for PT Re-Evaluation 10/04/21    Authorization Type UHC, Recerrt/PN sent on 09/02/2021 at 5th visit for 8 additional visits stariting 09/09/2021.    Authorization - Visit Number 9    Authorization - Number of Visits 60    PT Start Time 0845    PT Stop Time 0923    PT Time Calculation (min) 38 min    Activity Tolerance Patient tolerated treatment well;No increased pain    Behavior During Therapy WFL for tasks assessed/performed             Past Medical History:  Diagnosis Date   Adult acne    Dysfunctional uterine bleeding    Obesity     Past Surgical History:  Procedure Laterality Date   CARPAL TUNNEL RELEASE  09/2010   Right   TONSILLECTOMY AND ADENOIDECTOMY     TYMPANOSTOMY TUBE PLACEMENT     bilateral    There were no vitals filed for this visit.   Subjective Assessment - 09/25/21 0849     Subjective still doing well, still no pain    Patient Stated Goals To be pain free with activities    Currently in Pain? No/denies                               [facility] Adult PT Treatment/Exercise - 09/25/21 0848       Ankle Exercises: Aerobic   Nustep L6 x 6 min      Ankle Exercises: Stretches   Soleus Stretch 3 reps;30 seconds    Soleus Stretch Limitations slant board    Gastroc Stretch 3 reps;30 seconds    Gastroc Stretch Limitations slant board    Other Stretch standing soleus stretch 3x30 sec      Ankle Exercises: Supine   T-Band seated Lt ankle 4-way x 20 reps; L4 band      Ankle Exercises: Seated   Heel Raises Left;20 reps;3 seconds    with hand resistance   Other Seated Ankle Exercises arch strengthenging through big toe and heel 5 sec hold 2x10      Ankle Exercises: Standing   SLS on foam: LLE 5x10 sec    Heel Raises Both;20 reps    Heel Raises Limitations bil concentric; LLE eccentric    Side Shuffle (Round Trip) 2-3' x 10 reps                          PT Long Term Goals - 09/25/21 0923       PT LONG TERM GOAL #1   Title FOTO >/= 72%    Status On-going    Target Date 10/04/21      PT LONG TERM GOAL #2   Title Pt reports left ankle / foot pain < 2/10 with standing & gait activites.    Baseline 12/7: met x 1 week; will ensure carryover    Status On-going    Target Date 10/04/21      PT LONG TERM GOAL #  3   Title Left ankle AROM within 5* of RLE    Baseline continue    Status On-going    Target Date 10/04/21      PT LONG TERM GOAL #4   Title Gait velocity fast pace >5.5 ft/sec without increase in pain    Baseline 5.0 feet/ second with pain in left ankle with push off    Status On-going    Target Date 10/04/21      PT LONG TERM GOAL #5   Baseline Step Climbing Power (watts) = body wt (kg) X gravity (9.8 m/s2) X step ht (m) X #steps  /  time (sec)    Status On-going    Target Date 10/04/21                   Plan - 09/25/21 0924     Clinical Impression Statement Pt continues to report no pain x 1 week so hopeful for continued improvement.  If she continues to report decreased pain anticipate d/c or hold next week.    PT Treatment/Interventions ADLs/Self Care Home Management;Cryotherapy;Electrical Stimulation;Moist Heat;Ultrasound;DME Instruction;Gait training;Stair training;Functional mobility training;Therapeutic activities;Therapeutic exercise;Balance training;Neuromuscular re-education;Patient/family education;Orthotic Fit/Training;Manual techniques;Taping;Joint Manipulations    PT Next Visit Plan IASTM PRN, steps, foot/ankle strengthening; begin looking at goals    PT Home  Exercise Plan Access Code: [email or web address]    Consulted and Agree with Plan of Care Patient             Patient will benefit from skilled therapeutic intervention in order to improve the following deficits and impairments:  Abnormal gait, Decreased balance, Decreased endurance, Decreased mobility, Decreased range of motion, Decreased strength, Obesity, Pain  Visit Diagnosis: Stiffness of left ankle, not elsewhere classified  Pain in left ankle and joints of left foot  Muscle weakness (generalized)  Other abnormalities of gait and mobility  Unsteadiness on feet     Problem List Patient Active Problem List   Diagnosis Date Noted   Reactive thrombocytosis 09/17/2021   Heel pain, chronic, left 09/05/2021   Contusion of right knee 09/20/2020   Prediabetes 08/14/2020   Mixed hyperlipidemia 08/10/2020   Seasonal allergies 08/10/2020   Pain in right shoulder 08/07/2020   Carpal tunnel syndrome, bilateral 08/07/2020   H/O carpal tunnel repair 11/04/2018   Routine general medical examination at a health care facility 09/12/2014   Anxiety 09/17/2012   Class 3 severe obesity due to excess calories with body mass index (BMI) of 50.0 to 59.9 in adult [facility]) 06/30/2011     Laureen Abrahams, PT, DPT 09/25/21 9:26 AM     [facility] [address] [place], [place], [address] Phone: [phone]   Fax:  [phone]  Name: MYRLA MALANOWSKI MRN: [id number] Date of Birth: 04-Oct-1976

## 2021-09-30 ENCOUNTER — Ambulatory Visit (INDEPENDENT_AMBULATORY_CARE_PROVIDER_SITE_OTHER): Payer: 59

## 2021-09-30 DIAGNOSIS — E538 Deficiency of other specified B group vitamins: Secondary | ICD-10-CM

## 2021-09-30 MED ORDER — CYANOCOBALAMIN 1000 MCG/ML IJ SOLN
1000.0000 ug | Freq: Once | INTRAMUSCULAR | Status: AC
  Administered 2021-09-30: 1000 ug via INTRAMUSCULAR

## 2021-09-30 NOTE — Progress Notes (Signed)
Per orders of Dr. Banks, injection of Cyanocobalamin 1000 mcg given by Oluwadamilola Deliz L Chaundra Abreu. °Patient tolerated injection well.  °

## 2021-10-01 ENCOUNTER — Ambulatory Visit: Payer: 59 | Admitting: Physical Therapy

## 2021-10-01 ENCOUNTER — Encounter: Payer: Self-pay | Admitting: Physical Therapy

## 2021-10-01 ENCOUNTER — Other Ambulatory Visit: Payer: Self-pay

## 2021-10-01 DIAGNOSIS — M25572 Pain in left ankle and joints of left foot: Secondary | ICD-10-CM | POA: Diagnosis not present

## 2021-10-01 DIAGNOSIS — R2681 Unsteadiness on feet: Secondary | ICD-10-CM

## 2021-10-01 DIAGNOSIS — R2689 Other abnormalities of gait and mobility: Secondary | ICD-10-CM | POA: Diagnosis not present

## 2021-10-01 DIAGNOSIS — M6281 Muscle weakness (generalized): Secondary | ICD-10-CM

## 2021-10-01 DIAGNOSIS — M25672 Stiffness of left ankle, not elsewhere classified: Secondary | ICD-10-CM

## 2021-10-01 NOTE — Therapy (Addendum)
[facility] Physical Therapy [address] [place], [place], [address] Phone: [phone]   Fax:  [phone]  Physical Therapy Treatment/Discharge Summary  Patient Details  Name: Erika Mullins MRN: [id number] Date of Birth: December 08, 1975 Referring Provider (PT): Bevely Palmer Persons, [place]   Encounter Date: 10/01/2021   PT End of Session - 10/01/21 0934     Visit Number 10    Date for PT Re-Evaluation 10/04/21    Authorization Type UHC, Recerrt/PN sent on 09/02/2021 at 5th visit for 8 additional visits stariting 09/09/2021.    Authorization - Visit Number 10    Authorization - Number of Visits 60    PT Start Time 0930    PT Stop Time 0955    PT Time Calculation (min) 25 min    Activity Tolerance Patient tolerated treatment well;No increased pain    Behavior During Therapy WFL for tasks assessed/performed             Past Medical History:  Diagnosis Date   Adult acne    Dysfunctional uterine bleeding    Obesity     Past Surgical History:  Procedure Laterality Date   CARPAL TUNNEL RELEASE  09/2010   Right   TONSILLECTOMY AND ADENOIDECTOMY     TYMPANOSTOMY TUBE PLACEMENT     bilateral    There were no vitals filed for this visit.   Subjective Assessment - 10/01/21 0933     Subjective continues to report minimal pain, had a little twinge this weekend but resolved quickly    Patient Stated Goals To be pain free with activities    Currently in Pain? No/denies                [facility] PT Assessment - 10/01/21 0001       Assessment   Medical Diagnosis M79.672,G89.29 (ICD-10-CM) - Heel pain, chronic, left    Referring Provider (PT) Bevely Palmer Persons, PA    Onset Date/Surgical Date 07/31/21      Observation/Other Assessments   Focus on Therapeutic Outcomes (FOTO)  72      AROM   Right Ankle Dorsiflexion 10    Left Ankle Dorsiflexion 2   supine; 12 in stance   Left Ankle Plantar Flexion 68    Left Ankle Inversion 28    Left Ankle Eversion  23      Ambulation/Gait   Gait velocity 5.98 ft/sec at fast pace    Stairs Assistance Details (indicate cue type and reason) Power = 57.16 watts                           [facility] Adult PT Treatment/Exercise - 10/01/21 0932       Ankle Exercises: Aerobic   Nustep L6 x 6 min                          PT Long Term Goals - 10/01/21 0958       PT LONG TERM GOAL #1   Title FOTO >/= 72%    Status Achieved    Target Date 10/04/21      PT LONG TERM GOAL #2   Title Pt reports left ankle / foot pain < 2/10 with standing & gait activites.    Status Achieved    Target Date 10/04/21      PT LONG TERM GOAL #3   Title Left ankle AROM within 5* of RLE    Baseline continue  Status Achieved    Target Date 10/04/21      PT LONG TERM GOAL #4   Title Gait velocity fast pace >5.5 ft/sec without increase in pain    Baseline 5.0 feet/ second with pain in left ankle with push off    Status Achieved    Target Date 10/04/21      PT LONG TERM GOAL #5   Title stairs power >55 watts    Baseline Step Climbing Power (watts) = body wt (kg) X gravity (9.8 m/s2) X step ht (m) X #steps  /  time (sec)    Status Achieved    Target Date 10/04/21                   Plan - 10/01/21 0958     Clinical Impression Statement Pt has met all goals and feels ready to be released from PT.   Plan to hold x 30 days and she will call if symptoms return.  Otherwise will d/c PT.    PT Treatment/Interventions ADLs/Self Care Home Management;Cryotherapy;Electrical Stimulation;Moist Heat;Ultrasound;DME Instruction;Gait training;Stair training;Functional mobility training;Therapeutic activities;Therapeutic exercise;Balance training;Neuromuscular re-education;Patient/family education;Orthotic Fit/Training;Manual techniques;Taping;Joint Manipulations    PT Next Visit Plan hold PT x 30 days, recert or d/c    PT Home Exercise Plan Access Code: [email or web address]    Consulted and Agree with Plan of  Care Patient             Patient will benefit from skilled therapeutic intervention in order to improve the following deficits and impairments:  Abnormal gait, Decreased balance, Decreased endurance, Decreased mobility, Decreased range of motion, Decreased strength, Obesity, Pain  Visit Diagnosis: Stiffness of left ankle, not elsewhere classified  Pain in left ankle and joints of left foot  Muscle weakness (generalized)  Other abnormalities of gait and mobility  Unsteadiness on feet     Problem List Patient Active Problem List   Diagnosis Date Noted   Reactive thrombocytosis 09/17/2021   Heel pain, chronic, left 09/05/2021   Contusion of right knee 09/20/2020   Prediabetes 08/14/2020   Mixed hyperlipidemia 08/10/2020   Seasonal allergies 08/10/2020   Pain in right shoulder 08/07/2020   Carpal tunnel syndrome, bilateral 08/07/2020   H/O carpal tunnel repair 11/04/2018   Routine general medical examination at a health care facility 09/12/2014   Anxiety 09/17/2012   Class 3 severe obesity due to excess calories with body mass index (BMI) of 50.0 to 59.9 in adult [facility]) 06/30/2011      Laureen Abrahams, PT, DPT 10/01/21 9:59 AM    [facility] [address] [place], [place], [address] Phone: [phone]   Fax:  [phone]  Name: Erika Mullins MRN: [id number] Date of Birth: May 01, 1976     PHYSICAL THERAPY DISCHARGE SUMMARY  Visits from Start of Care: 10  Current functional level related to goals / functional outcomes: See above   Remaining deficits: N/a   Education / Equipment: HEP, DN   Patient agrees to discharge. Patient goals were met. Patient is being discharged due to meeting the stated rehab goals.   Laureen Abrahams, PT, DPT 11/27/21 11:59 AM  [facility] [address] [place], [place], [address] Phone: [phone]   Fax:  [phone]

## 2021-10-03 ENCOUNTER — Encounter: Payer: 59 | Admitting: Physical Therapy

## 2021-10-31 ENCOUNTER — Ambulatory Visit (INDEPENDENT_AMBULATORY_CARE_PROVIDER_SITE_OTHER): Payer: 59

## 2021-10-31 DIAGNOSIS — E538 Deficiency of other specified B group vitamins: Secondary | ICD-10-CM

## 2021-10-31 MED ORDER — CYANOCOBALAMIN 1000 MCG/ML IJ SOLN
1000.0000 ug | Freq: Once | INTRAMUSCULAR | Status: AC
  Administered 2021-10-31: 1000 ug via INTRAMUSCULAR

## 2021-10-31 NOTE — Progress Notes (Signed)
Pt here for monthly B12 injection #3 of 3 per Dr Volanda Napoleon.  B12 1065mcg given IM left deltoid and pt tolerated injection well.  Pt has completed 29mo of monthly injections. Instructed to start an over-the-counter vitamin B12 supplement of at least 1000-2000 IUs daily. Pt verb understanding.

## 2021-11-01 ENCOUNTER — Ambulatory Visit: Payer: 59

## 2021-11-20 ENCOUNTER — Ambulatory Visit: Payer: 59 | Admitting: Pulmonary Disease

## 2021-11-20 ENCOUNTER — Encounter: Payer: Self-pay | Admitting: Pulmonary Disease

## 2021-11-20 ENCOUNTER — Other Ambulatory Visit: Payer: Self-pay

## 2021-11-20 VITALS — BP 118/78 | HR 88 | Temp 98.7°F | Ht 69.0 in | Wt 333.0 lb

## 2021-11-20 DIAGNOSIS — R400 Somnolence: Secondary | ICD-10-CM

## 2021-11-20 NOTE — Patient Instructions (Signed)
We will schedule you for home sleep study  Update your results as soon as reviewed  Tentative follow-up in 3 to 4 months  Call with significant concerns  Continue weight loss efforts  Sleep Apnea Sleep apnea affects breathing during sleep. It causes breathing to stop for 10 seconds or more, or to become shallow. People with sleep apnea usually snore loudly. It can also increase the risk of: Heart attack. Stroke. Being very overweight (obese). Diabetes. Heart failure. Irregular heartbeat. High blood pressure. The goal of treatment is to help you breathe normally again. What are the causes? The most common cause of this condition is a collapsed or blocked airway. There are three kinds of sleep apnea: Obstructive sleep apnea. This is caused by a blocked or collapsed airway. Central sleep apnea. This happens when the brain does not send the right signals to the muscles that control breathing. Mixed sleep apnea. This is a combination of obstructive and central sleep apnea. What increases the risk? Being overweight. Smoking. Having a small airway. Being older. Being female. Drinking alcohol. Taking medicines to calm yourself (sedatives or tranquilizers). Having family members with the condition. Having a tongue or tonsils that are larger than normal. What are the signs or symptoms? Trouble staying asleep. Loud snoring. Headaches in the morning. Waking up gasping. Dry mouth or sore throat in the morning. Being sleepy or tired during the day. If you are sleepy or tired during the day, you may also: Not be able to focus your mind (concentrate). Forget things. Get angry a lot and have mood swings. Feel sad (depressed). Have changes in your personality. Have less interest in sex, if you are female. Be unable to have an erection, if you are female. How is this treated?  Sleeping on your side. Using a medicine to get rid of mucus in your nose (decongestant). Avoiding the use of  alcohol, medicines to help you relax, or certain pain medicines (narcotics). Losing weight, if needed. Changing your diet. Quitting smoking. Using a machine to open your airway while you sleep, such as: An oral appliance. This is a mouthpiece that shifts your lower jaw forward. A CPAP device. This device blows air through a mask when you breathe out (exhale). An EPAP device. This has valves that you put in each nostril. A BIPAP device. This device blows air through a mask when you breathe in (inhale) and breathe out. Having surgery if other treatments do not work. Follow these instructions at home: Lifestyle Make changes that your doctor recommends. Eat a healthy diet. Lose weight if needed. Avoid alcohol, medicines to help you relax, and some pain medicines. Do not smoke or use any products that contain nicotine or tobacco. If you need help quitting, ask your doctor. General instructions Take over-the-counter and prescription medicines only as told by your doctor. If you were given a machine to use while you sleep, use it only as told by your doctor. If you are having surgery, make sure to tell your doctor you have sleep apnea. You may need to bring your device with you. Keep all follow-up visits. Contact a doctor if: The machine that you were given to use during sleep bothers you or does not seem to be working. You do not get better. You get worse. Get help right away if: Your chest hurts. You have trouble breathing in enough air. You have an uncomfortable feeling in your back, arms, or stomach. You have trouble talking. One side of your body feels weak.  A part of your face is hanging down. These symptoms may be an emergency. Get help right away. Call your local emergency services (911 in the [place] Do not wait to see if the symptoms will go away. Do not drive yourself to the hospital. Summary This condition affects breathing during sleep. The most common cause is a collapsed  or blocked airway. The goal of treatment is to help you breathe normally while you sleep. This information is not intended to replace advice given to you by your health care provider. Make sure you discuss any questions you have with your health care provider. Document Revised: 05/15/2021 Document Reviewed: 09/14/2020 Elsevier Patient Education  2022 [employer].

## 2021-11-20 NOTE — Progress Notes (Signed)
Erika Mullins    [id number]    [birth year]  Primary Care Physician:Banks, Langley Adie, MD  Referring Physician: Billie Ruddy, MD [address] [place],   [id number]  Chief complaint:   Patient being seen for daytime fatigue  HPI:  Patient with longstanding history of fatigue, daytime sleepiness  History of snoring, no history of witnessed apneas  Denies any dryness of her mouth in the mornings, no morning headaches, no night sweats Memory is good  Both mom and dad have obstructive sleep apnea with mom on CPAP  Usually goes to bed between 9 and 11 PM  Very rare smoker No significant alcohol use  Outpatient Encounter Medications as of 11/20/2021  Medication Sig   cetirizine (ZYRTEC) 10 MG tablet Take 1 tablet (10 mg total) by mouth daily.   citalopram (CELEXA) 40 MG tablet Take 1 tablet (40 mg total) by mouth daily.   ibuprofen (ADVIL,MOTRIN) 200 MG tablet Take 600-800 mg by mouth as needed for pain.   loperamide (IMODIUM) 2 MG capsule Take 2 mg by mouth 2 (two) times daily as needed for diarrhea or loose stools.   Multiple Vitamin (MULTIVITAMIN WITH MINERALS) TABS Take 1 tablet by mouth every morning.   norethindrone-ethinyl estradiol (NORTREL 7/7/7) 0.5/0.75/1-35 MG-MCG tablet Take 1 tablet by mouth daily.   Omega-3 Fatty Acids (FISH OIL) 1000 MG CAPS Take 1 capsule by mouth daily.   Vitamin D, Ergocalciferol, (DRISDOL) 1.25 MG (50000 UNIT) CAPS capsule Take 1 capsule (50,000 Units total) by mouth every 7 (seven) days.   No facility-administered encounter medications on file as of 11/20/2021.    Allergies as of 11/20/2021 - Review Complete 11/20/2021  Allergen Reaction Noted   Doxycycline Hives 08/24/2007   Nickel Hives 07/24/2011    Past Medical History:  Diagnosis Date   Adult acne    Dysfunctional uterine bleeding    Obesity     Past Surgical History:  Procedure Laterality Date   CARPAL TUNNEL RELEASE  09/2010   Right    TONSILLECTOMY AND ADENOIDECTOMY     TYMPANOSTOMY TUBE PLACEMENT     bilateral    Family History  Problem Relation Age of Onset   Hyperlipidemia Father    Heart disease Father    Obesity Father    Cancer Maternal Grandfather        Pancreatic   Hyperlipidemia Maternal Grandfather    Heart attack Maternal Grandfather    Hyperlipidemia Other    Hypertension Other    Obesity Other    Stroke Maternal Grandmother    Breast cancer Neg Hx     Social History   Socioeconomic History   Marital status: Single    Spouse name: Not on file   Number of children: Not on file   Years of education: Not on file   Highest education level: Not on file  Occupational History   Not on file  Tobacco Use   Smoking status: Never   Smokeless tobacco: Never  Substance and Sexual Activity   Alcohol use: Yes    Comment: large glass of wine 3-4 nights/week   Drug use: No   Sexual activity: Not on file  Other Topics Concern   Not on file  Social History Narrative   Not on file   Social Determinants of Health   Financial Resource Strain: Not on file  Food Insecurity: Not on file  Transportation Needs: Not on file  Physical Activity: Not on file  Stress: Not on file  Social Connections: Not on file  Intimate Partner Violence: Not on file    Review of Systems  Constitutional:  Positive for fatigue.  Psychiatric/Behavioral:  Positive for sleep disturbance.    Vitals:   11/20/21 1546  BP: 118/78  Pulse: 88  Temp: 98.7 F (37.1 C)  SpO2: 95%     Physical Exam Constitutional:      Appearance: She is obese.  HENT:     Head: Normocephalic.     Nose: Nose normal.     Mouth/Throat:     Mouth: Mucous membranes are moist.     Comments: Macroglossia, crowded oropharynx Eyes:     Extraocular Movements: Extraocular movements intact.     Pupils: Pupils are equal, round, and reactive to light.  Cardiovascular:     Rate and Rhythm: Normal rate and regular rhythm.     Heart sounds: No  murmur heard.   No friction rub.  Pulmonary:     Effort: No respiratory distress.     Breath sounds: No stridor. No wheezing or rhonchi.  Musculoskeletal:     Cervical back: No rigidity or tenderness.  Neurological:     Mental Status: She is alert.  Psychiatric:        Mood and Affect: Mood normal.   Results of the Epworth flowsheet 11/20/2021  Sitting and reading 1  Watching TV 1  Sitting, inactive in a public place (e.g. a theatre or a meeting) 0  As a passenger in a car for an hour without a break 1  Lying down to rest in the afternoon when circumstances permit 3  Sitting and talking to someone 0  Sitting quietly after a lunch without alcohol 0  In a car, while stopped for a few minutes in traffic 0  Total score 6   Data Reviewed: No previous sleep studies on record  Assessment:  Daytime sleepiness  Daytime fatigue  Nonrestorative sleep  Obesity  Moderate probability of significant obstructive sleep apnea  Pathophysiology of sleep disordered breathing discussed with the patient Treatment options for sleep disordered breathing discussed with the patient  Plan/Recommendations: Schedule patient for home sleep study  Encouraged about weight loss efforts  Risks of not treating sleep disordered breathing discussed  Tentative follow-up in 3 to 4 months  Encouraged to give [place] a call if any significant concerns  Sherrilyn Rist MD [facility] 11/20/2021, 4:00 PM  CC: Billie Ruddy, MD

## 2021-11-28 ENCOUNTER — Encounter: Payer: Self-pay | Admitting: Family Medicine

## 2021-11-28 ENCOUNTER — Ambulatory Visit: Payer: 59 | Admitting: Family Medicine

## 2021-11-28 ENCOUNTER — Other Ambulatory Visit: Payer: Self-pay

## 2021-11-28 ENCOUNTER — Ambulatory Visit (INDEPENDENT_AMBULATORY_CARE_PROVIDER_SITE_OTHER): Payer: 59

## 2021-11-28 VITALS — BP 140/80 | HR 76 | Temp 98.5°F | Wt 333.4 lb

## 2021-11-28 DIAGNOSIS — J302 Other seasonal allergic rhinitis: Secondary | ICD-10-CM | POA: Diagnosis not present

## 2021-11-28 DIAGNOSIS — R058 Other specified cough: Secondary | ICD-10-CM

## 2021-11-28 MED ORDER — BENZONATATE 100 MG PO CAPS: 100.0000 mg | ORAL_CAPSULE | Freq: Two times a day (BID) | ORAL | 0 refills | Status: DC | PRN

## 2021-11-28 MED ORDER — MONTELUKAST SODIUM 10 MG PO TABS: 10.0000 mg | ORAL_TABLET | Freq: Every day | ORAL | 3 refills | Status: DC

## 2021-11-28 MED ORDER — FLUTICASONE PROPIONATE 50 MCG/ACT NA SUSP: 1.0000 | Freq: Every day | NASAL | 5 refills | Status: AC

## 2021-11-28 NOTE — Progress Notes (Signed)
Subjective:    Patient ID: Erika Mullins, female    DOB: April 11, 1976, 46 y.o.   MRN: [id number]  Chief Complaint  Patient presents with   Cough    Lingering dry cough, x 1 month, no other symptoms    HPI Patient was seen today for ongoing concern.  Pt with dry cough x 1 month.  Symptoms started after having a viral URI like illness on 10/20/21.  Pt also with stuffy nose.  Has not taken anything for her symptoms.  Denies SOB, fever, chills, wheezing.  Starting to feel pressure in maxillary sinuses today.  Notes allergy symptoms seemed to start after returning from a trip to [place] world.    Past Medical History:  Diagnosis Date   Adult acne    Dysfunctional uterine bleeding    Obesity     Allergies  Allergen Reactions   Doxycycline Hives   Nickel Hives    ROS General: Denies fever, chills, night sweats, changes in weight, changes in appetite HEENT: Denies headaches, ear pain, changes in vision, rhinorrhea, sore throat +stuffy nose, post nasal drainage CV: Denies CP, palpitations, SOB, orthopnea Pulm: Denies SOB, cough, wheezing +cough GI: Denies abdominal pain, nausea, vomiting, diarrhea, constipation GU: Denies dysuria, hematuria, frequency, vaginal discharge Msk: Denies muscle cramps, joint pains Neuro: Denies weakness, numbness, tingling Skin: Denies rashes, bruising Psych: Denies depression, anxiety, hallucinations     Objective:    Blood pressure 140/80, pulse 76, temperature 98.5 F (36.9 C), temperature source Oral, weight (!) 333 lb 6.4 oz (151.2 kg), SpO2 98 %.  Gen. Pleasant, well-nourished, in no distress, normal affect   HEENT: [place]/AT, face symmetric, conjunctiva clear, no scleral icterus, PERRLA, EOMI, nares patent without drainage, pharynx without erythema or exudate.  TMs full bilaterally.  No cervical lymphadenopathy. Lungs: no accessory muscle use, CTAB, no wheezes or rales Cardiovascular: RRR, no m/r/g, no peripheral edema Musculoskeletal: No deformities,  no cyanosis or clubbing, normal tone Neuro:  A&Ox3, CN II-XII intact, normal gait Skin:  Warm, no lesions/ rash   Wt Readings from Last 3 Encounters:  11/28/21 (!) 333 lb 6.4 oz (151.2 kg)  11/20/21 (!) 333 lb (151 kg)  09/17/21 (!) 334 lb 4.8 oz (151.6 kg)    Lab Results  Component Value Date   WBC 13.1 (H) 09/17/2021   HGB 13.8 09/17/2021   HCT 41.4 09/17/2021   PLT 513 (H) 09/17/2021   GLUCOSE 118 (H) 09/17/2021   CHOL 247 (H) 08/21/2021   TRIG 328.0 (H) 08/21/2021   HDL 41.60 08/21/2021   LDLDIRECT 167.0 08/21/2021   LDLCALC 153 (H) 08/10/2020   ALT 12 09/17/2021   AST 16 09/17/2021   NA 136 09/17/2021   K 3.8 09/17/2021   CL 104 09/17/2021   CREATININE 0.80 09/17/2021   BUN 10 09/17/2021   CO2 24 09/17/2021   TSH 1.85 08/21/2021   HGBA1C 6.5 08/21/2021    Assessment/Plan:  Post-viral cough syndrome -Discussed symptoms likely postviral however consider bronchitis, though no wheezing on exam, or pneumonia. -Discussed OTC antihistamine, cough/cold medication -Given duration of symptoms will obtain CXR -Start Flonase and Tessalon as needed - Plan: DG Chest 2 View, benzonatate (TESSALON) 100 MG capsule  Seasonal allergies -Given increased allergy symptoms despite OTC antihistamines we will start Singulair -Continue saline nasal rinse -We will also start Flonase. - Plan: montelukast (SINGULAIR) 10 MG tablet, fluticasone (FLONASE) 50 MCG/ACT nasal spray  F/u as needed  Grier Mitts, MD

## 2021-11-28 NOTE — Patient Instructions (Signed)
A prescription for Singulair (an allergy pill) and Flonase nasal spray was sent to your pharmacy.  A prescription was also sent for Tessalon, which is a medication to help with cough.  We will contact you with the results of your chest x-ray.

## 2022-01-20 ENCOUNTER — Ambulatory Visit: Payer: 59 | Admitting: Family Medicine

## 2022-02-18 ENCOUNTER — Telehealth: Payer: Self-pay | Admitting: Pulmonary Disease

## 2022-02-18 NOTE — Telephone Encounter (Signed)
I called and left a message per DPR to verify that a sleep study has been completed. Waiting on a call back.  ?

## 2022-02-19 ENCOUNTER — Ambulatory Visit: Payer: 59 | Admitting: Pulmonary Disease

## 2022-02-19 NOTE — Telephone Encounter (Signed)
She did not come in for a follow up and closing encounter.  ?

## 2022-02-20 ENCOUNTER — Encounter: Payer: Self-pay | Admitting: Pulmonary Disease

## 2022-03-30 ENCOUNTER — Other Ambulatory Visit: Payer: Self-pay | Admitting: Family Medicine

## 2022-03-30 DIAGNOSIS — F419 Anxiety disorder, unspecified: Secondary | ICD-10-CM

## 2022-04-26 ENCOUNTER — Other Ambulatory Visit: Payer: Self-pay | Admitting: Family Medicine

## 2022-04-26 DIAGNOSIS — J302 Other seasonal allergic rhinitis: Secondary | ICD-10-CM

## 2022-07-07 ENCOUNTER — Encounter: Payer: Self-pay | Admitting: Family Medicine

## 2022-07-11 ENCOUNTER — Encounter: Payer: Self-pay | Admitting: Family Medicine

## 2022-07-11 ENCOUNTER — Ambulatory Visit: Payer: 59 | Admitting: Family Medicine

## 2022-07-11 VITALS — BP 124/70 | HR 92 | Temp 98.0°F | Wt 339.2 lb

## 2022-07-11 DIAGNOSIS — N906 Unspecified hypertrophy of vulva: Secondary | ICD-10-CM | POA: Diagnosis not present

## 2022-07-11 DIAGNOSIS — L2082 Flexural eczema: Secondary | ICD-10-CM

## 2022-07-11 DIAGNOSIS — L304 Erythema intertrigo: Secondary | ICD-10-CM

## 2022-07-11 DIAGNOSIS — K130 Diseases of lips: Secondary | ICD-10-CM

## 2022-07-11 MED ORDER — NYSTATIN 100000 UNIT/GM EX CREA: 1.0000 | TOPICAL_CREAM | Freq: Two times a day (BID) | CUTANEOUS | 0 refills | Status: DC

## 2022-07-11 MED ORDER — TRIAMCINOLONE ACETONIDE 0.1 % EX CREA: 1.0000 | TOPICAL_CREAM | Freq: Two times a day (BID) | CUTANEOUS | 1 refills | Status: AC

## 2022-07-11 NOTE — Progress Notes (Signed)
Subjective:    Patient ID: Erika Mullins, female    DOB: 11-27-1975, 46 y.o.   MRN: [id number]  Chief Complaint  Patient presents with   labia change    Noticed a couple months ago, has not gotten any better. Rt  labia has changed in shape.    HPI Patient was seen today for ongoing concern.  Pt noticed a slight enlargement in R labia majora a few months ago while in the shower.  Pt denies pain, irritation, bumps or other lesions.  Just wanted to get it checked.  Pt also notes rash on L anterior elbow.  Thought it was due to heat.  Pruritic at times.  Uses cortisone cream.  Pt does not typically use lotion on skin.  Patient also with mild irritation of bilateral upper thighs/groin endorses increased sweating at baseline.  Past Medical History:  Diagnosis Date   Adult acne    Dysfunctional uterine bleeding    Obesity     Allergies  Allergen Reactions   Doxycycline Hives   Nickel Hives    ROS General: Denies fever, chills, night sweats, changes in weight, changes in appetite HEENT: Denies headaches, ear pain, changes in vision, rhinorrhea, sore throat CV: Denies CP, palpitations, SOB, orthopnea Pulm: Denies SOB, cough, wheezing GI: Denies abdominal pain, nausea, vomiting, diarrhea, constipation GU: Denies dysuria, hematuria, frequency, vaginal discharge  +R labia majora edema Msk: Denies muscle cramps, joint pains Neuro: Denies weakness, numbness, tingling Skin: Denies rashes, bruising  +rash L elbow, irritation/taking bilateral upper thigh Psych: Denies depression, anxiety, hallucinations     Objective:    Blood pressure 124/70, pulse 92, temperature 98 F (36.7 C), temperature source Oral, weight (!) 339 lb 3.2 oz (153.9 kg), SpO2 97 %.  Gen. Pleasant, well-nourished, in no distress, normal affect   HEENT: [place]/AT, face symmetric, conjunctiva clear, no scleral icterus, PERRLA, EOMI, nares patent without drainage Lungs: no accessory muscle use, CTAB, no wheezes or  rales Cardiovascular: RRR, no m/r/g, no peripheral edema Musculoskeletal: No deformities, no cyanosis or clubbing, normal tone Neuro:  A&Ox3, CN II-XII intact, normal gait GU: Normal-appearing external female genitalia.  Mild enlargement of right labia majora without erythema or induration. No lesions or masses appreciated on palpation.  No vaginal discharge.  Normal urethral meatus, perineum, anus. Skin:  Warm, dry, intact.  Left anterior elbow with dry mildly hypertrophic skin.  Satellite lesions noted in bilateral line/upper thighs.   Wt Readings from Last 3 Encounters:  07/11/22 (!) 339 lb 3.2 oz (153.9 kg)  11/28/21 (!) 333 lb 6.4 oz (151.2 kg)  11/20/21 (!) 333 lb (151 kg)    Lab Results  Component Value Date   WBC 13.1 (H) 09/17/2021   HGB 13.8 09/17/2021   HCT 41.4 09/17/2021   PLT 513 (H) 09/17/2021   GLUCOSE 118 (H) 09/17/2021   CHOL 247 (H) 08/21/2021   TRIG 328.0 (H) 08/21/2021   HDL 41.60 08/21/2021   LDLDIRECT 167.0 08/21/2021   LDLCALC 153 (H) 08/10/2020   ALT 12 09/17/2021   AST 16 09/17/2021   NA 136 09/17/2021   K 3.8 09/17/2021   CL 104 09/17/2021   CREATININE 0.80 09/17/2021   BUN 10 09/17/2021   CO2 24 09/17/2021   TSH 1.85 08/21/2021   HGBA1C 6.5 08/21/2021    Assessment/Plan:  Intertrigo -Discussed wearing moisture wicking fabric and other tips to help prevent recurrent symptoms -Given handout - Plan: nystatin cream (MYCOSTATIN)  Flexural eczema -Discussed supportive care including using a good  moisturizer 3 times per day. -Given handout - Plan: triamcinolone cream (KENALOG) 0.1 %  Enlargement of labia -Benign-appearing mild enlargement of right labia majora. -Reassurance -Continue to monitor  F/u prn  Grier Mitts, MD

## 2022-09-10 ENCOUNTER — Other Ambulatory Visit: Payer: Self-pay | Admitting: Family Medicine

## 2022-09-10 DIAGNOSIS — Z3041 Encounter for surveillance of contraceptive pills: Secondary | ICD-10-CM

## 2022-11-10 ENCOUNTER — Other Ambulatory Visit: Payer: Self-pay | Admitting: Family Medicine

## 2022-11-10 DIAGNOSIS — F419 Anxiety disorder, unspecified: Secondary | ICD-10-CM

## 2022-11-28 ENCOUNTER — Encounter: Payer: Self-pay | Admitting: Physician Assistant

## 2022-11-28 ENCOUNTER — Ambulatory Visit: Payer: 59 | Admitting: Physician Assistant

## 2022-11-28 ENCOUNTER — Ambulatory Visit: Payer: Self-pay

## 2022-11-28 DIAGNOSIS — M722 Plantar fascial fibromatosis: Secondary | ICD-10-CM | POA: Insufficient documentation

## 2022-11-28 DIAGNOSIS — M79672 Pain in left foot: Secondary | ICD-10-CM

## 2022-11-28 DIAGNOSIS — G8929 Other chronic pain: Secondary | ICD-10-CM

## 2022-11-28 NOTE — Progress Notes (Signed)
Office Visit Note   Patient: Erika Mullins           Date of Birth: [birth year]           MRN: [id number] Visit Date: 11/28/2022              Requested by: Billie Ruddy, MD [place],  [place] [id number] PCP: Billie Ruddy, MD  Chief Complaint  Patient presents with   Left Heel - Pain      HPI: Manina is a pleasant 47 year old woman who has seen Dr. Durward Fortes in the past for plantar fasciitis.  He has treated her with physical therapy and dry needling which has been very helpful to her.  She does have a very tight heel cord on that side.  She comes in today with 3-week return of plantar heel pain but also burning on the back of her heel.  Denies any injury she has been trying to wear her sneakers more  Assessment & Plan: Visit Diagnoses:  1. Heel pain, chronic, left   2. Plantar fasciitis     Plan: Had a long discussion with Tashia about her options.  We could start using some Voltaren gel both in the posterior and plantar heel she does have some insertional Achilles tendinitis along with plantar fasciitis and a tight heel cord.  She did well with therapy and dry needling and we will refer her back there.  We gave her some arch supports and I recommended different types of shoes for her.  We also briefly discussed other options including an injection which she is not interested in.  She may be a candidate for shockwave therapy.  Ultimately if she could not find solve this conservatively I would refer her to Dr. Sharol Given to discuss lengthening of her Achilles  Follow-Up Instructions: Return in about 1 week (around 12/05/2022).   Ortho Exam  Patient is alert, oriented, no adenopathy, well-dressed, normal affect, normal respiratory effort. Examination of her left foot she has warm good sensation no redness no swelling.  She does have tenderness over the posterior heel at the insertion of the Achilles though her Achilles strength is quite good.  Tenderness over  the focal insertion of the plantar fascia.  She is neurologically intact compartments are soft and nontender  Imaging: No results found. No images are attached to the encounter.  Labs: Lab Results  Component Value Date   HGBA1C 6.5 08/21/2021   HGBA1C 6.0 (H) 08/10/2020     Lab Results  Component Value Date   ALBUMIN 3.8 09/17/2021   ALBUMIN 4.0 09/02/2016   ALBUMIN 4.1 09/08/2014    No results found for: "MG" Lab Results  Component Value Date   VD25OH 18.17 (L) 08/21/2021   VD25OH 21 (L) 08/10/2020    No results found for: "PREALBUMIN"    Latest Ref Rng & Units 09/17/2021   11:15 AM 08/21/2021   11:11 AM 03/05/2021   12:25 PM  CBC EXTENDED  WBC 4.0 - 10.5 K/uL 13.1  14.7  10.0   RBC 3.87 - 5.11 MIL/uL 4.85  4.92  5.09   Hemoglobin 12.0 - 15.0 g/dL 13.8  13.6  14.2   HCT 36.0 - 46.0 % 41.4  41.9  43.6   Platelets 150 - 400 K/uL 513  518.0  511   NEUT# 1.7 - 7.7 K/uL 8.0  9.3  5.9   Lymph# 0.7 - 4.0 K/uL 3.8  4.4  3.1  There is no height or weight on file to calculate BMI.  Orders:  Orders Placed This Encounter  Procedures   XR Os Calcis Left   Ambulatory referral to Physical Therapy   No orders of the defined types were placed in this encounter.    Procedures: No procedures performed  Clinical Data: No additional findings.  ROS:  All other systems negative, except as noted in the HPI. Review of Systems  Objective: Vital Signs: There were no vitals taken for this visit.  Specialty Comments:  No specialty comments available.  PMFS History: Patient Active Problem List   Diagnosis Date Noted   Plantar fasciitis 11/28/2022   Reactive thrombocytosis 09/17/2021   Heel pain, chronic, left 09/05/2021   Contusion of right knee 09/20/2020   Prediabetes 08/14/2020   Mixed hyperlipidemia 08/10/2020   Seasonal allergies 08/10/2020   Pain in right shoulder 08/07/2020   Carpal tunnel syndrome, bilateral 08/07/2020   H/O carpal tunnel repair  11/04/2018   Routine general medical examination at a health care facility 09/12/2014   Anxiety 09/17/2012   Class 3 severe obesity due to excess calories with body mass index (BMI) of 50.0 to 59.9 in adult [facility]) 06/30/2011   Past Medical History:  Diagnosis Date   Adult acne    Dysfunctional uterine bleeding    Obesity     Family History  Problem Relation Age of Onset   Hyperlipidemia Father    Heart disease Father    Obesity Father    Cancer Maternal Grandfather        Pancreatic   Hyperlipidemia Maternal Grandfather    Heart attack Maternal Grandfather    Hyperlipidemia Other    Hypertension Other    Obesity Other    Stroke Maternal Grandmother    Breast cancer Neg Hx     Past Surgical History:  Procedure Laterality Date   CARPAL TUNNEL RELEASE  09/2010   Right   TONSILLECTOMY AND ADENOIDECTOMY     TYMPANOSTOMY TUBE PLACEMENT     bilateral   Social History   Occupational History   Not on file  Tobacco Use   Smoking status: Never   Smokeless tobacco: Never  Substance and Sexual Activity   Alcohol use: Yes    Comment: large glass of wine 3-4 nights/week   Drug use: No   Sexual activity: Not on file

## 2022-12-17 ENCOUNTER — Other Ambulatory Visit: Payer: Self-pay | Admitting: Family Medicine

## 2022-12-17 DIAGNOSIS — F419 Anxiety disorder, unspecified: Secondary | ICD-10-CM

## 2022-12-18 ENCOUNTER — Ambulatory Visit: Payer: 59 | Admitting: Physical Therapy

## 2022-12-18 NOTE — Therapy (Incomplete)
OUTPATIENT PHYSICAL THERAPY LOWER EXTREMITY EVALUATION   Patient Name: Erika Mullins MRN: [id number] DOB:28-Feb-1976, 47 y.o., female Today's Date: 12/18/2022  END OF SESSION:   Past Medical History:  Diagnosis Date   Adult acne    Dysfunctional uterine bleeding    Obesity    Past Surgical History:  Procedure Laterality Date   CARPAL TUNNEL RELEASE  09/2010   Right   TONSILLECTOMY AND ADENOIDECTOMY     TYMPANOSTOMY TUBE PLACEMENT     bilateral   Patient Active Problem List   Diagnosis Date Noted   Plantar fasciitis 11/28/2022   Reactive thrombocytosis 09/17/2021   Heel pain, chronic, left 09/05/2021   Contusion of right knee 09/20/2020   Prediabetes 08/14/2020   Mixed hyperlipidemia 08/10/2020   Seasonal allergies 08/10/2020   Pain in right shoulder 08/07/2020   Carpal tunnel syndrome, bilateral 08/07/2020   H/O carpal tunnel repair 11/04/2018   Routine general medical examination at a health care facility 09/12/2014   Anxiety 09/17/2012   Class 3 severe obesity due to excess calories with body mass index (BMI) of 50.0 to 59.9 in adult [facility]) 06/30/2011    PCP: Grier Mitts, MD  REFERRING PROVIDER: Persons, Bevely Palmer, [place]  REFERRING DIAG: [phone] (ICD-10-CM) - Heel pain, chronic, left   THERAPY DIAG:  No diagnosis found.  Rationale for Evaluation and Treatment: Rehabilitation  ONSET DATE: ***  SUBJECTIVE:   SUBJECTIVE STATEMENT: ***  PERTINENT HISTORY: CTS with release, obesity, anxiety  PAIN:  Are you having pain? {OPRCPAIN:27236}  PRECAUTIONS: None  WEIGHT BEARING RESTRICTIONS: No  FALLS:  Has patient fallen in last 6 months? {fallsyesno:27318}  LIVING ENVIRONMENT: Lives with: {OPRC lives with:25569::"lives with their family"} Lives in: {Lives in:25570} Stairs: {opstairs:27293} Has following equipment at home: {Assistive devices:23999}  OCCUPATION: ***  PLOF: {PLOF:24004}  PATIENT GOALS: ***  NEXT MD VISIT:  PRN  OBJECTIVE:   PATIENT SURVEYS:  12/18/22: FOTO ***  COGNITION: Overall cognitive status: Within functional limits for tasks assessed     SENSATION: 12/18/22: {sensation:27233}  EDEMA:  12/18/22: {edema:24020}  MUSCLE LENGTH: Hamstrings: Right *** deg; Left *** deg Marcello Moores test: Right *** deg; Left *** deg  POSTURE: {posture:25561}  PALPATION: 12/18/22: ***  LOWER EXTREMITY ROM:  A/P ROM Right eval Left eval  Ankle dorsiflexion    Ankle plantarflexion    Ankle inversion    Ankle eversion     (Blank rows = not tested)  LOWER EXTREMITY MMT:  MMT Right eval Left eval  Ankle dorsiflexion    Ankle plantarflexion    Ankle inversion    Ankle eversion     (Blank rows = not tested)  LOWER EXTREMITY SPECIAL TESTS:  12/18/22: {LEspecialtests:26242}  FUNCTIONAL TESTS:  12/18/22: {Functional tests:24029}  GAIT: 12/18/22: Distance walked: *** Assistive device utilized: {Assistive devices:23999} Level of assistance: {Levels of assistance:24026} Comments: ***   TODAY'S TREATMENT:  DATE:  12/18/22 ***    PATIENT EDUCATION:  Education details: HEP Person educated: Patient Education method: Education officer, environmental, and Handouts Education comprehension: verbalized understanding, returned demonstration, and needs further education  HOME EXERCISE PROGRAM: Access Code: [email or web address] URL: [email or web address] Date: 12/18/2022 Prepared by: Faustino Congress  Exercises - Standing Bilateral Gastroc Stretch with Step  - 1 x daily - 7 x weekly - 1 sets - 2-3 reps - 30 seconds hold - Standing Soleus Stretch on Step with Counter Support  - 1 x daily - 7 x weekly - 1 sets - 2-3 reps - 30 seconds hold - Standing Bilateral Heel Raise on Step  - 1 x daily - 7 x weekly - 3 sets - 10 reps - 5 seconds hold - Standing Eccentric Heel Raise  - 1  x daily - 7 x weekly - 3 sets - 10 reps - 5 seconds hold - Standing Single Leg Ball Rolls - Forward Backward  - 1 x daily - 7 x weekly - 2 sets - 15 reps - Standing Single Leg Ball Rolls - Side to Side  - 1 x daily - 7 x weekly - 2 sets - 15 reps - Standing Single Leg Ball Rolling in Circles  - 1 x daily - 7 x weekly - 2 sets - 15 reps - Slant Board Gastrocnemius Stretch  - 2-3 x daily - 7 x weekly - 1 sets - 3 reps - 60 seconds hold - Slant Board Soleus Stretch  - 2-3 x daily - 7 x weekly - 1 sets - 3 reps - 60 seconds hold - Arch Lifting  - 2 x daily - 7 x weekly - 1 sets - 10 reps - 5 sec hold - Seated Plantar Fascia Mobilization with Small Ball  - 1 x daily - 7 x weekly - 3 sets - 10 reps  ASSESSMENT:  CLINICAL IMPRESSION: Patient is a 47 y.o. female who was seen today for physical therapy evaluation and treatment for recurring Lt heel pain consistent with plantar fasciitis.  She demonstrates ***.  She will benefit from PT to address deficits listed.   OBJECTIVE IMPAIRMENTS: {opptimpairments:25111}.   ACTIVITY LIMITATIONS: {activitylimitations:27494}  PARTICIPATION LIMITATIONS: {participationrestrictions:25113}  PERSONAL FACTORS: Past/current experiences and 1-2 comorbidities: obesity, anxiety  are also affecting patient's functional outcome.   REHAB POTENTIAL: Good  CLINICAL DECISION MAKING: Evolving/moderate complexity  EVALUATION COMPLEXITY: Moderate   GOALS: Goals reviewed with patient? Yes  SHORT TERM GOALS: Target date: ***  Independent with initial HEP Goal status: INITIAL  2.  *** Goal status: {GOALSTATUS:25110}  3.  *** Goal status: {GOALSTATUS:25110}   LONG TERM GOALS: Target date: ***  Independent with final HEP Goal status: INITIAL  2.  FOTO score improved to *** Goal status: INITIAL  3.  *** improved to *** for *** Goal status: INIITAL  4.  Report pain < ***/10 with *** for improved function Goal status: INITIAL  5.  *** Goal status:  {GOALSTATUS:25110}  6.  *** Goal status: {GOALSTATUS:25110}     PLAN:  PT FREQUENCY: {rehab frequency:25116}  PT DURATION: {rehab duration:25117}  PLANNED INTERVENTIONS: {rehab planned interventions:25118::"Therapeutic exercises","Therapeutic activity","Neuromuscular re-education","Balance training","Gait training","Patient/Family education","Self Care","Joint mobilization"}  PLAN FOR NEXT SESSION: review/update HEP, *** DN, manual/modalities PRN   Faustino Congress, PT 12/18/2022, 7:14 AM

## 2023-01-05 ENCOUNTER — Ambulatory Visit: Payer: 59 | Admitting: Physical Therapy

## 2023-01-19 ENCOUNTER — Encounter: Payer: Self-pay | Admitting: Family Medicine

## 2023-01-29 IMAGING — DX DG CHEST 2V
2 series · 2 of 2 positions shown · non-contrast
Comparison: None.

CLINICAL DATA: Nonproductive cough for 1 month.

EXAM:
CHEST - 2 VIEW

[chest pa]
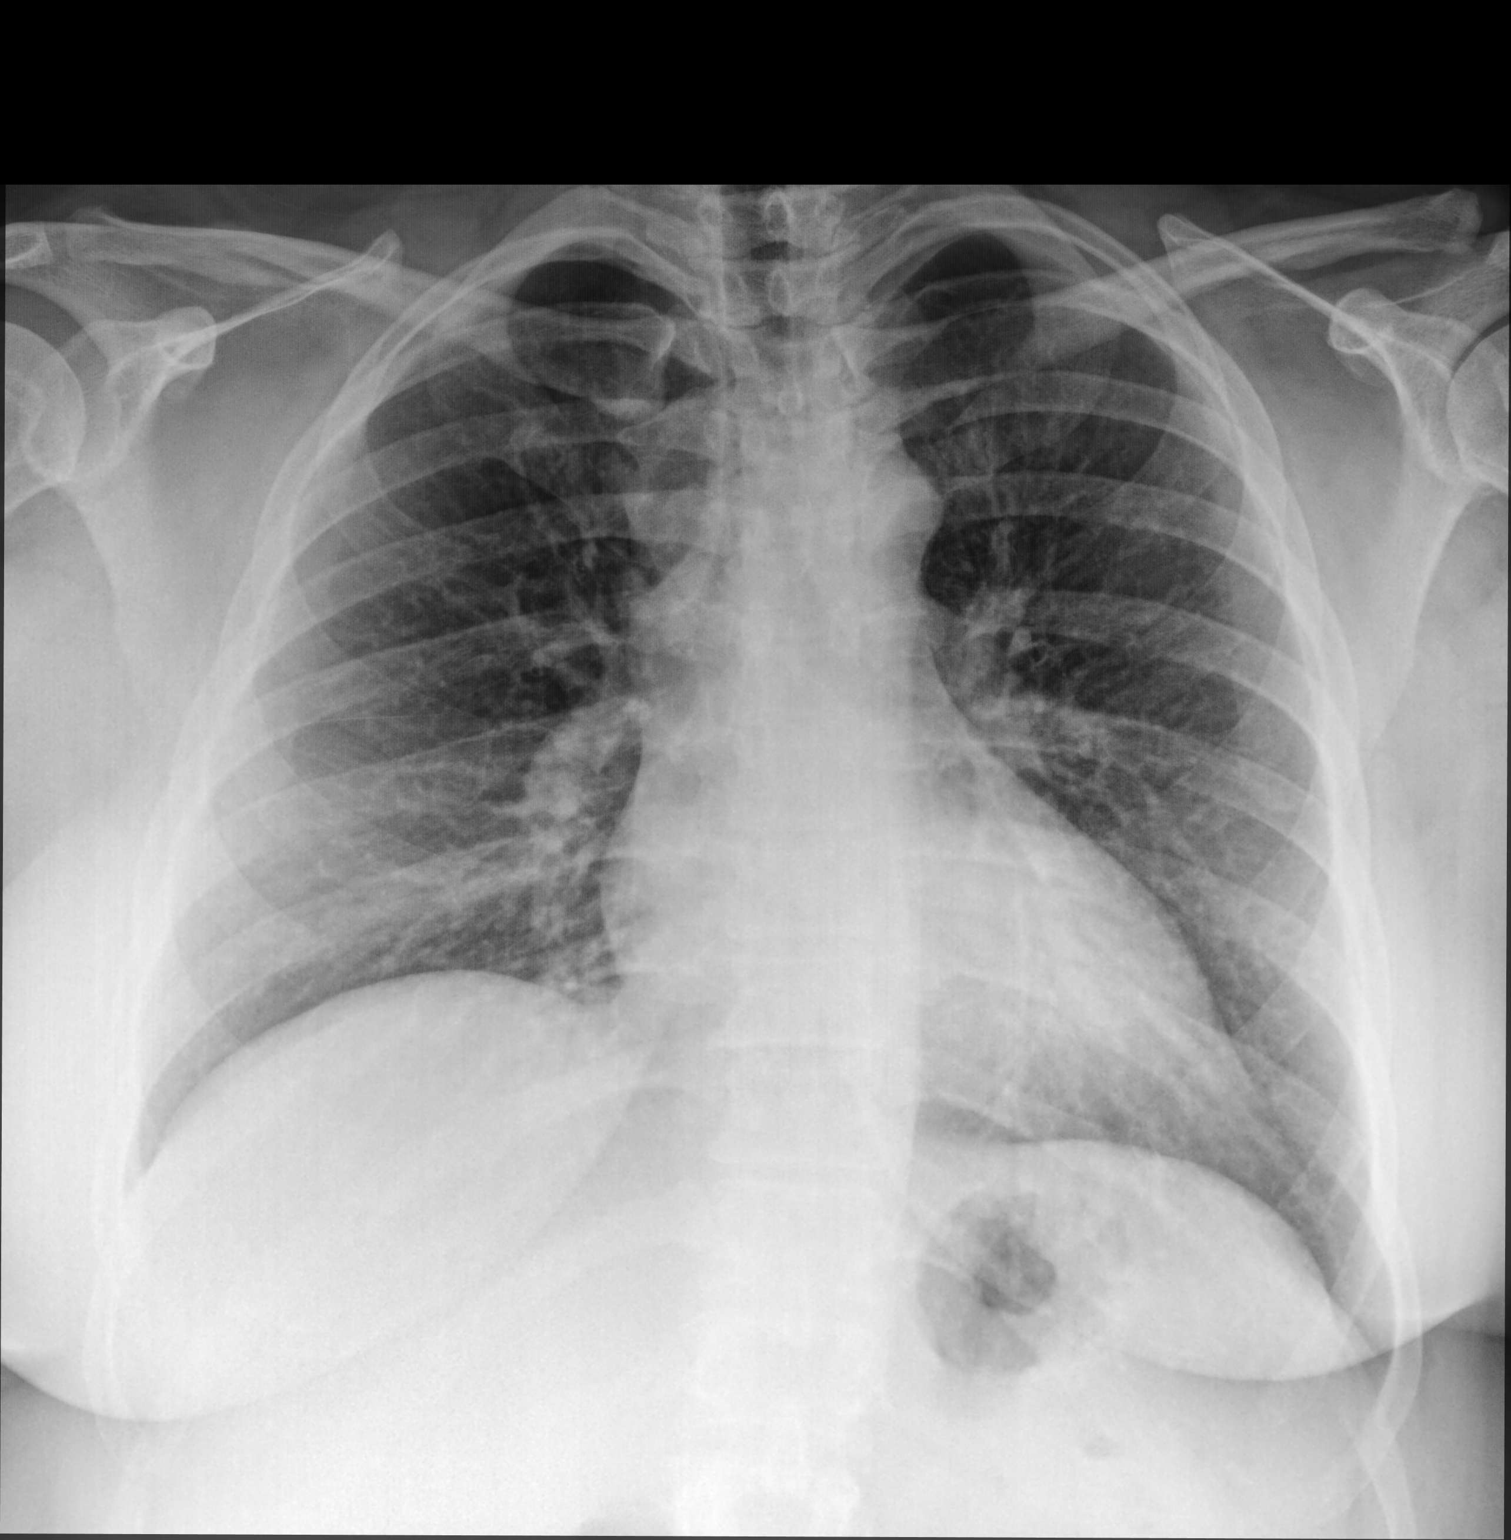

[chest lat]
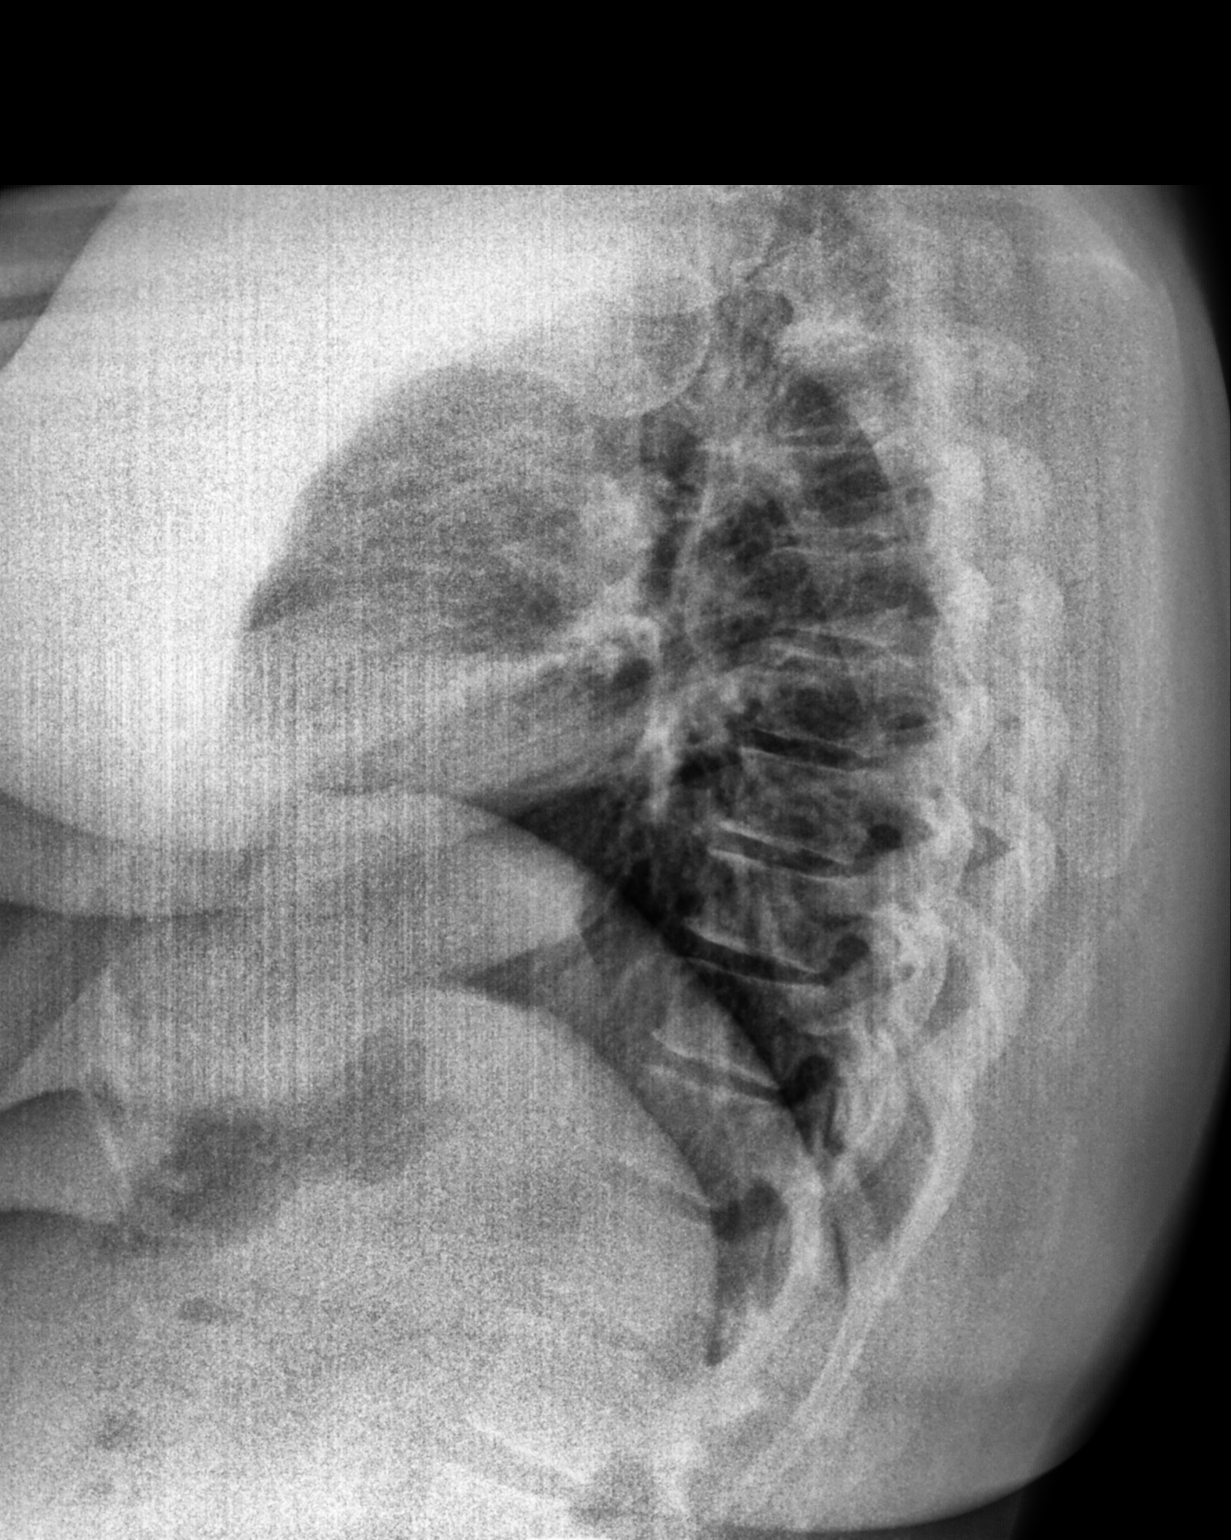

[2 of 2 positions shown; findings below may reference images not displayed]

FINDINGS: The heart size and mediastinal contours are within normal limits.
Both lungs are clear. The visualized skeletal structures are
unremarkable.
IMPRESSION: No active cardiopulmonary disease.

## 2023-02-16 NOTE — Telephone Encounter (Signed)
Attempted to reach pt. Left a voicemail to call us back.  

## 2023-02-25 ENCOUNTER — Other Ambulatory Visit: Payer: Self-pay | Admitting: Family Medicine

## 2023-02-25 DIAGNOSIS — Z3041 Encounter for surveillance of contraceptive pills: Secondary | ICD-10-CM

## 2023-03-04 ENCOUNTER — Other Ambulatory Visit: Payer: Self-pay | Admitting: Family Medicine

## 2023-03-04 DIAGNOSIS — Z1231 Encounter for screening mammogram for malignant neoplasm of breast: Secondary | ICD-10-CM

## 2023-03-20 ENCOUNTER — Ambulatory Visit: Payer: 59

## 2023-03-25 ENCOUNTER — Ambulatory Visit
Admission: RE | Admit: 2023-03-25 | Discharge: 2023-03-25 | Disposition: A | Discharge status: Home / Self Care | Payer: 59 | Source: Ambulatory Visit | Attending: Family Medicine | Admitting: Family Medicine

## 2023-03-25 DIAGNOSIS — Z1231 Encounter for screening mammogram for malignant neoplasm of breast: Secondary | ICD-10-CM

## 2023-04-16 ENCOUNTER — Ambulatory Visit: Payer: 59 | Admitting: Physical Therapy

## 2023-04-16 ENCOUNTER — Encounter: Payer: Self-pay | Admitting: Physical Therapy

## 2023-04-16 ENCOUNTER — Other Ambulatory Visit: Payer: Self-pay

## 2023-04-16 DIAGNOSIS — M6281 Muscle weakness (generalized): Secondary | ICD-10-CM

## 2023-04-16 DIAGNOSIS — M25572 Pain in left ankle and joints of left foot: Secondary | ICD-10-CM

## 2023-04-16 DIAGNOSIS — M25672 Stiffness of left ankle, not elsewhere classified: Secondary | ICD-10-CM | POA: Diagnosis not present

## 2023-04-16 NOTE — Therapy (Signed)
OUTPATIENT PHYSICAL THERAPY LOWER EXTREMITY EVALUATION   Patient Name: Erika Mullins MRN: [id number] DOB:January 12, 1976, 47 y.o., female Today's Date: 04/16/2023  END OF SESSION:  PT End of Session - 04/16/23 0801     Visit Number 1    Number of Visits 17    Date for PT Re-Evaluation 06/11/23    Authorization Type [employer], 60VL    Progress Note Due on Visit 10    PT Start Time 0802    PT Stop Time 0839    PT Time Calculation (min) 37 min    Activity Tolerance Patient tolerated treatment well;No increased pain    Behavior During Therapy [facility] for tasks assessed/performed             Past Medical History:  Diagnosis Date   Adult acne    Dysfunctional uterine bleeding    Obesity    Past Surgical History:  Procedure Laterality Date   CARPAL TUNNEL RELEASE  09/2010   Right   TONSILLECTOMY AND ADENOIDECTOMY     TYMPANOSTOMY TUBE PLACEMENT     bilateral   Patient Active Problem List   Diagnosis Date Noted   Plantar fasciitis 11/28/2022   Reactive thrombocytosis 09/17/2021   Heel pain, chronic, left 09/05/2021   Contusion of right knee 09/20/2020   Prediabetes 08/14/2020   Mixed hyperlipidemia 08/10/2020   Seasonal allergies 08/10/2020   Pain in right shoulder 08/07/2020   Carpal tunnel syndrome, bilateral 08/07/2020   H/O carpal tunnel repair 11/04/2018   Routine general medical examination at a health care facility 09/12/2014   Anxiety 09/17/2012   Class 3 severe obesity due to excess calories with body mass index (BMI) of 50.0 to 59.9 in adult (HCC) 06/30/2011    PCP: Deeann Saint, MD  REFERRING PROVIDER: Persons, [place], [place]  REFERRING DIAG: [phone] (ICD-10-CM) - Heel pain, chronic, left  THERAPY DIAG:  Stiffness of left ankle, not elsewhere classified  Pain in left ankle and joints of left foot  Muscle weakness (generalized)  Rationale for Evaluation and Treatment: Rehabilitation  ONSET DATE: several years  SUBJECTIVE:   SUBJECTIVE  STATEMENT: Pt states she was diagnosed with plantar fasciitis and bone spurs. Has had PT in the past with relief. Now having more pain into achilles, less heel pain. Variable by day, notes a limp at times. Seems to vary based on footwear and activity.  Works with a Systems analyst 3x/week, does some stretching for it PRN. Also notes her L foot seems to be getting more flat (over past 8-10 months). No N/T in LE, occasional swelling in foot/ankle when she's up on her feet a lot. Works a lot of events which tend to aggravate it.   PERTINENT HISTORY: prior carpal tunnel repair, anxiety PAIN:  Are you having pain: none Location/description: L heel and achilles Best-worst over past week: 0-7/10  - aggravating factors: morning pain, prolonged WB, standing/walking (10-40min on average) - Easing factors: rest, footwear    PRECAUTIONS: None  WEIGHT BEARING RESTRICTIONS: No  FALLS:  Has patient fallen in last 6 months? No  LIVING ENVIRONMENT: 2 story home 4STE, 14 steps to bed/bath one rail, with dogs  OCCUPATION: works for city, TEFL teacher   PLOF: Independent  PATIENT GOALS: be able to walk more, be able to more consistently go to gym   NEXT MD VISIT: TBD   OBJECTIVE:   DIAGNOSTIC FINDINGS:  Refer to EPIC for imaging history  PATIENT SURVEYS:  FOTO 61 current, 73 predicted in 10 visits  COGNITION: Overall cognitive status: Within functional limits for tasks assessed     SENSATION: No neuro complaints, appears grossly intact with exam today  EDEMA/OBSERVATION:  No observable edema on eval although she does endorse swelling in foot/ankle at times with increased activity   PALPATION: Concordant tenderness distal L achilles especially at calcaneal insertion, none throughout plantar fascia, heel, or gastroc soleus  LOWER EXTREMITY ROM:   Active ROM Right eval Left eval  Knee flexion    Knee extension    Ankle dorsiflexion 5 deg Active: lacking 5  deg Passive: neutral with pain   Ankle plantarflexion    Ankle inversion 22 deg  14 deg  Ankle eversion 20 deg  21 deg   (Blank rows = not tested) (Key: WFL = within functional limits not formally assessed, * = concordant pain, s = stiffness/stretching sensation, NT = not tested)  Comments:    LOWER EXTREMITY MMT:  MMT Right eval Left eval  Knee flexion    Knee extension    Ankle dorsiflexion 5 5  Ankle plantarflexion (modified sitting) 5 4+ nonpainful  Ankle inversion 5 5  Ankle eversion 5 5   (Blank rows = not tested) (Key: WFL = within functional limits not formally assessed, * = concordant pain, s = stiffness/stretching sensation, NT = not tested)  Comments:    LOWER EXTREMITY SPECIAL TESTS:  deferred  FUNCTIONAL TESTS:  deferred  GAIT: Distance walked: within clinic Assistive device utilized: None Level of assistance: Complete Independence Comments: noted increase in pronation, mildly widened BOS and increased forward pelvic rotation BIL with LE advancement   TODAY'S TREATMENT:                                                                                                                              [facility] Adult PT Treatment:                                                DATE: 04/16/23 Therapeutic Exercise: Toe yoga x10 seated Seated heel/toe raises x10 Standing gastroc stretch 3x30sec w UE support at counter HEP handout + education and education on relevant anatomy/physiology as it pertains to exercise   PATIENT EDUCATION:  Education details: Pt education on PT impairments, prognosis, and POC. Informed consent. Rationale for interventions, safe/appropriate HEP performance Person educated: Patient Education method: Explanation, Demonstration, Tactile cues, Verbal cues, and Handouts Education comprehension: verbalized understanding, returned demonstration, verbal cues required, tactile cues required, and needs further education    HOME EXERCISE PROGRAM: Access  Code: [email or web address] URL: [email or web address] Date: 04/16/2023 Prepared by: Fransisco Hertz  Exercises - Toe Yoga - Alternating Great Toe and Lesser Toe Extension  - 2-3 x daily - 7 x weekly - 1 sets - 10 reps - Seated Heel Toe Raises  - 2-3 x daily - 7 x weekly -  1 sets - 10 reps - Standing Gastroc Stretch  - 2-3 x daily - 7 x weekly - 1 sets - 1-3 reps - 30sec hold  ASSESSMENT:  CLINICAL IMPRESSION: Pt is a very pleasant 47 year old woman who arrives to PT evaluation on this date for chronic heel pain. Pt reports difficulty primarily with prolonged weightbearing due to pain. During today's session pt demonstrates limitations in ankle mobility and strength, concordant tenderness with palpation of distal achilles. She is also noted to have some increase in pronation on L foot, difficulty with coordination for HEP but improves w/ repetition, nonpainful. These impairments are likely contributing to pain with aforementioned activities, recommend skilled PT to address. No adverse events, pt departs without increase in pain. Pt departs today's session in no acute distress, all voiced questions/concerns addressed appropriately from PT perspective.    OBJECTIVE IMPAIRMENTS: Abnormal gait, decreased activity tolerance, decreased endurance, decreased mobility, difficulty walking, decreased ROM, decreased strength, and pain.   ACTIVITY LIMITATIONS: carrying, standing, squatting, stairs, and locomotion level  PARTICIPATION LIMITATIONS: meal prep, cleaning, laundry, community activity, and occupation  PERSONAL FACTORS: Time since onset of injury/illness/exacerbation and 1 comorbidity: anxiety  are also affecting patient's functional outcome.   REHAB POTENTIAL: Good  CLINICAL DECISION MAKING: Stable/uncomplicated  EVALUATION COMPLEXITY: Low   GOALS: Goals reviewed with patient? No  SHORT TERM GOALS: Target date: 05/14/2023 Pt will demonstrate appropriate understanding and performance of  initially prescribed HEP in order to facilitate improved independence with management of symptoms.  Baseline: HEP provided on eval Goal status: INITIAL   2. Pt will score greater than or equal to 67 on FOTO in order to demonstrate improved perception of function due to symptoms.  Baseline: 61  Goal status: INITIAL    LONG TERM GOALS: Target date: 06/11/2023 Pt will score 73 or greater on FOTO in order to demonstrate improved perception of function due to symptoms.  Baseline: 61 Goal status: INITIAL  2.  Pt will demonstrate at least 5 degrees of L ankle dorsiflexion AROM in order to facilitate improved tolerance to functional movements such as stairs, walking.   Baseline: see ROM chart above Goal status: INITIAL  3.  Pt will report/demonstrate ability to walk/stand up to 1hr with less than 3 pt increase in pain on NPS in order to facilitate improved tolerance to work/community activities.  Baseline: pain after 10-29min on average Goal status: INITIAL  4.  Pt will demonstrate grossly symmetrical ankle MMT in all planes in order to facilitate improved functional strength.  Baseline: see MMT chart above  Goal status: INITIAL  5. Pt will demonstrate appropriate performance of final prescribed HEP in order to facilitate improved self-management of symptoms post-discharge.   Baseline: initial HEP prescribed  Goal status: INITIAL    6. Pt will report at least 50% decrease in overall pain levels in past week in order to facilitate improved tolerance to basic ADLs/mobility.   Baseline: 0-7/10 on NPS  Goal status: INITIAL     PLAN:  PT FREQUENCY: 1-2x/week  PT DURATION: 8 weeks  PLANNED INTERVENTIONS: Therapeutic exercises, Therapeutic activity, Neuromuscular re-education, Balance training, Gait training, Patient/Family education, Self Care, Joint mobilization, Joint manipulation, Stair training, Aquatic Therapy, Dry Needling, Electrical stimulation, Cryotherapy, Moist heat, Taping,  Manual therapy, and Re-evaluation  PLAN FOR NEXT SESSION: Review/update HEP PRN. Work on [employer] exercises as appropriate with emphasis on ankle DF ROM, calf strength, and foot intrinsic/extrinsic coordination in open and closed chain. Symptom modification strategies as indicated/appropriate.  Ashley Murrain PT, DPT 04/16/2023 2:21 PM

## 2023-04-27 ENCOUNTER — Other Ambulatory Visit: Payer: Self-pay | Admitting: Family Medicine

## 2023-04-27 ENCOUNTER — Encounter: Payer: 59 | Admitting: Physical Therapy

## 2023-04-27 DIAGNOSIS — F419 Anxiety disorder, unspecified: Secondary | ICD-10-CM

## 2023-04-30 ENCOUNTER — Encounter: Payer: 59 | Admitting: Physical Therapy

## 2023-05-01 NOTE — Therapy (Signed)
OUTPATIENT PHYSICAL THERAPY TREATMENT NOTE    Patient Name: Erika Mullins MRN: [id number] DOB:1975-12-20, 47 y.o., female Today's Date: 05/04/2023  END OF SESSION:  PT End of Session - 05/04/23 1256     Visit Number 2    Number of Visits 17    Date for PT Re-Evaluation 06/11/23    Authorization Type [employer], 60VL    Progress Note Due on Visit 10    PT Start Time 1300    PT Stop Time 1343    PT Time Calculation (min) 43 min    Activity Tolerance Patient tolerated treatment well;No increased pain    Behavior During Therapy WFL for tasks assessed/performed              Past Medical History:  Diagnosis Date   Adult acne    Dysfunctional uterine bleeding    Obesity    Past Surgical History:  Procedure Laterality Date   CARPAL TUNNEL RELEASE  09/2010   Right   TONSILLECTOMY AND ADENOIDECTOMY     TYMPANOSTOMY TUBE PLACEMENT     bilateral   Patient Active Problem List   Diagnosis Date Noted   Plantar fasciitis 11/28/2022   Reactive thrombocytosis 09/17/2021   Heel pain, chronic, left 09/05/2021   Contusion of right knee 09/20/2020   Prediabetes 08/14/2020   Mixed hyperlipidemia 08/10/2020   Seasonal allergies 08/10/2020   Pain in right shoulder 08/07/2020   Carpal tunnel syndrome, bilateral 08/07/2020   H/O carpal tunnel repair 11/04/2018   Routine general medical examination at a health care facility 09/12/2014   Anxiety 09/17/2012   Class 3 severe obesity due to excess calories with body mass index (BMI) of 50.0 to 59.9 in adult (HCC) 06/30/2011    PCP: Deeann Saint, MD  REFERRING PROVIDER: Persons, [place], [place]  REFERRING DIAG: [phone] (ICD-10-CM) - Heel pain, chronic, left  THERAPY DIAG:  Stiffness of left ankle, not elsewhere classified  Pain in left ankle and joints of left foot  Muscle weakness (generalized)  Other abnormalities of gait and mobility  Rationale for Evaluation and Treatment: Rehabilitation  ONSET DATE: several  years  SUBJECTIVE:  Per eval - Pt states she was diagnosed with plantar fasciitis and bone spurs. Has had PT in the past with relief. Now having more pain into achilles, less heel pain. Variable by day, notes a limp at times. Seems to vary based on footwear and activity.  Works with a Systems analyst 3x/week, does some stretching for it PRN. Also notes her L foot seems to be getting more flat (over past 8-10 months). No N/T in LE, occasional swelling in foot/ankle when she's up on her feet a lot. Works a lot of events which tend to aggravate it.   SUBJECTIVE STATEMENT: 05/04/2023 4-5/10 pain at present, had some soreness over the weekend from an event Saturday. Otherwise doing well. Reports fair HEP adherence   PERTINENT HISTORY: prior carpal tunnel repair, anxiety PAIN:  Are you having pain: none Location/description: L heel and achilles Best-worst over past week: 0-7/10  - aggravating factors: morning pain, prolonged WB, standing/walking (10-53min on average) - Easing factors: rest, footwear    PRECAUTIONS: None  WEIGHT BEARING RESTRICTIONS: No  FALLS:  Has patient fallen in last 6 months? No  LIVING ENVIRONMENT: 2 story home 4STE, 14 steps to bed/bath one rail, with dogs  OCCUPATION: works for city, TEFL teacher   PLOF: Independent  PATIENT GOALS: be able to walk more, be able to more consistently go  to gym   NEXT MD VISIT: TBD   OBJECTIVE: (objective measures completed at initial evaluation unless otherwise dated)   DIAGNOSTIC FINDINGS:  Refer to EPIC for imaging history  PATIENT SURVEYS:  FOTO 61 current, 73 predicted in 10 visits  COGNITION: Overall cognitive status: Within functional limits for tasks assessed     SENSATION: No neuro complaints, appears grossly intact with exam today  EDEMA/OBSERVATION:  No observable edema on eval although she does endorse swelling in foot/ankle at times with increased activity   PALPATION: Concordant  tenderness distal L achilles especially at calcaneal insertion, none throughout plantar fascia, heel, or gastroc soleus  LOWER EXTREMITY ROM:   Active ROM Right eval Left eval  Knee flexion    Knee extension    Ankle dorsiflexion 5 deg Active: lacking 5 deg Passive: neutral with pain   Ankle plantarflexion    Ankle inversion 22 deg  14 deg  Ankle eversion 20 deg  21 deg   (Blank rows = not tested) (Key: WFL = within functional limits not formally assessed, * = concordant pain, s = stiffness/stretching sensation, NT = not tested)  Comments:    LOWER EXTREMITY MMT:  MMT Right eval Left eval  Knee flexion    Knee extension    Ankle dorsiflexion 5 5  Ankle plantarflexion (modified sitting) 5 4+ nonpainful  Ankle inversion 5 5  Ankle eversion 5 5   (Blank rows = not tested) (Key: WFL = within functional limits not formally assessed, * = concordant pain, s = stiffness/stretching sensation, NT = not tested)  Comments:    LOWER EXTREMITY SPECIAL TESTS:  deferred  FUNCTIONAL TESTS:  deferred  GAIT: Distance walked: within clinic Assistive device utilized: None Level of assistance: Complete Independence Comments: noted increase in pronation, mildly widened BOS and increased forward pelvic rotation BIL with LE advancement   TODAY'S TREATMENT:                                                                                                                              [facility] Adult PT Treatment:                                                DATE: 05/04/23 Therapeutic Exercise: Bike 5 min self paced during subjective Seated heel/toe raises 2x12 cues for setup Seated toe yoga 2x12 cues for tripod Seated toe scrunches 2x12 cues for tripod  Red band inversion/eversion 2x12 each cues for setup  4 inch step up LLE leading in // bars x24; no shoes 2 inch fwd step down // bars 2x12 BIL cues for control; no shoes 3x30sec standing calf stretch LLE only cues for setup and comfortable  ROM HEP update + education on appropriate performance    [facility] Adult PT Treatment:  DATE: 04/16/23 Therapeutic Exercise: Toe yoga x10 seated Seated heel/toe raises x10 Standing gastroc stretch 3x30sec w UE support at counter HEP handout + education and education on relevant anatomy/physiology as it pertains to exercise   PATIENT EDUCATION:  Education details: rationale for interventions, HEP  Person educated: Patient Education method: Explanation, Demonstration, Tactile cues, Verbal cues, and Handouts Education comprehension: verbalized understanding, returned demonstration, verbal cues required, tactile cues required, and needs further education    HOME EXERCISE PROGRAM: Access Code: [email or web address] URL: [email or web address] Date: 05/04/2023 Prepared by: Fransisco Hertz  Exercises - Toe Yoga - Alternating Great Toe and Lesser Toe Extension  - 2-3 x daily - 7 x weekly - 1 sets - 10 reps - Seated Heel Toe Raises  - 2-3 x daily - 7 x weekly - 1 sets - 10 reps - Standing Gastroc Stretch  - 2-3 x daily - 7 x weekly - 1 sets - 1-3 reps - 30sec hold - Seated Figure 4 Ankle Inversion with Resistance  - 2-3 x daily - 7 x weekly - 1 sets - 12 reps - Seated Ankle Eversion with Resistance  - 2-3 x daily - 7 x weekly - 1 sets - 12 reps  ASSESSMENT:  CLINICAL IMPRESSION: 05/04/2023 Pt arrives w/ report of 4-5/10 pain on NPS, no issues after first session. Today focusing on progression of WB activities with emphasis on foot intrinsic/extrinsic strength, gastroc/soleus strength, and ankle mobility. Initiation of WB activities without shoes in order to promote improved WB tolerance and functional strength of foot/ankle musculature. Cues as above, pt tolerates well overall with report of 2-3/10 pain on departure. HEP update as above. Recommend continuing along current POC in order to address relevant deficits and improve functional tolerance. Pt  departs today's session in no acute distress, all voiced questions/concerns addressed appropriately from PT perspective.     Per eval - Pt is a very pleasant 47 year old woman who arrives to PT evaluation on this date for chronic heel pain. Pt reports difficulty primarily with prolonged weightbearing due to pain. During today's session pt demonstrates limitations in ankle mobility and strength, concordant tenderness with palpation of distal achilles. She is also noted to have some increase in pronation on L foot, difficulty with coordination for HEP but improves w/ repetition, nonpainful. These impairments are likely contributing to pain with aforementioned activities, recommend skilled PT to address. No adverse events, pt departs without increase in pain. Pt departs today's session in no acute distress, all voiced questions/concerns addressed appropriately from PT perspective.    OBJECTIVE IMPAIRMENTS: Abnormal gait, decreased activity tolerance, decreased endurance, decreased mobility, difficulty walking, decreased ROM, decreased strength, and pain.   ACTIVITY LIMITATIONS: carrying, standing, squatting, stairs, and locomotion level  PARTICIPATION LIMITATIONS: meal prep, cleaning, laundry, community activity, and occupation  PERSONAL FACTORS: Time since onset of injury/illness/exacerbation and 1 comorbidity: anxiety  are also affecting patient's functional outcome.   REHAB POTENTIAL: Good  CLINICAL DECISION MAKING: Stable/uncomplicated  EVALUATION COMPLEXITY: Low   GOALS: Goals reviewed with patient? No  SHORT TERM GOALS: Target date: 05/14/2023 Pt will demonstrate appropriate understanding and performance of initially prescribed HEP in order to facilitate improved independence with management of symptoms.  Baseline: HEP provided on eval Goal status: INITIAL   2. Pt will score greater than or equal to 67 on FOTO in order to demonstrate improved perception of function due to  symptoms.  Baseline: 61  Goal status: INITIAL    LONG TERM GOALS: Target date: 06/11/2023 Pt will score  73 or greater on FOTO in order to demonstrate improved perception of function due to symptoms.  Baseline: 61 Goal status: INITIAL  2.  Pt will demonstrate at least 5 degrees of L ankle dorsiflexion AROM in order to facilitate improved tolerance to functional movements such as stairs, walking.   Baseline: see ROM chart above Goal status: INITIAL  3.  Pt will report/demonstrate ability to walk/stand up to 1hr with less than 3 pt increase in pain on NPS in order to facilitate improved tolerance to work/community activities.  Baseline: pain after 10-25min on average Goal status: INITIAL  4.  Pt will demonstrate grossly symmetrical ankle MMT in all planes in order to facilitate improved functional strength.  Baseline: see MMT chart above  Goal status: INITIAL  5. Pt will demonstrate appropriate performance of final prescribed HEP in order to facilitate improved self-management of symptoms post-discharge.   Baseline: initial HEP prescribed  Goal status: INITIAL    6. Pt will report at least 50% decrease in overall pain levels in past week in order to facilitate improved tolerance to basic ADLs/mobility.   Baseline: 0-7/10 on NPS  Goal status: INITIAL     PLAN:  PT FREQUENCY: 1-2x/week  PT DURATION: 8 weeks  PLANNED INTERVENTIONS: Therapeutic exercises, Therapeutic activity, Neuromuscular re-education, Balance training, Gait training, Patient/Family education, Self Care, Joint mobilization, Joint manipulation, Stair training, Aquatic Therapy, Dry Needling, Electrical stimulation, Cryotherapy, Moist heat, Taping, Manual therapy, and Re-evaluation  PLAN FOR NEXT SESSION: Review/update HEP PRN. Work on [employer] exercises as appropriate with emphasis on ankle DF ROM, calf strength, and foot intrinsic/extrinsic coordination in open and closed chain. Symptom modification strategies as  indicated/appropriate.     Ashley Murrain PT, DPT 05/04/2023 2:32 PM

## 2023-05-04 ENCOUNTER — Ambulatory Visit: Payer: 59 | Admitting: Physical Therapy

## 2023-05-04 ENCOUNTER — Encounter: Payer: Self-pay | Admitting: Physical Therapy

## 2023-05-04 DIAGNOSIS — M25672 Stiffness of left ankle, not elsewhere classified: Secondary | ICD-10-CM

## 2023-05-04 DIAGNOSIS — M6281 Muscle weakness (generalized): Secondary | ICD-10-CM

## 2023-05-04 DIAGNOSIS — R2689 Other abnormalities of gait and mobility: Secondary | ICD-10-CM

## 2023-05-04 DIAGNOSIS — M25572 Pain in left ankle and joints of left foot: Secondary | ICD-10-CM | POA: Diagnosis not present

## 2023-05-06 ENCOUNTER — Ambulatory Visit: Payer: 59 | Admitting: Physical Therapy

## 2023-05-06 ENCOUNTER — Encounter: Payer: Self-pay | Admitting: Physical Therapy

## 2023-05-06 DIAGNOSIS — M25572 Pain in left ankle and joints of left foot: Secondary | ICD-10-CM | POA: Diagnosis not present

## 2023-05-06 DIAGNOSIS — M6281 Muscle weakness (generalized): Secondary | ICD-10-CM | POA: Diagnosis not present

## 2023-05-06 DIAGNOSIS — R2689 Other abnormalities of gait and mobility: Secondary | ICD-10-CM | POA: Diagnosis not present

## 2023-05-06 DIAGNOSIS — R2681 Unsteadiness on feet: Secondary | ICD-10-CM

## 2023-05-06 DIAGNOSIS — M25672 Stiffness of left ankle, not elsewhere classified: Secondary | ICD-10-CM

## 2023-05-06 NOTE — Therapy (Signed)
OUTPATIENT PHYSICAL THERAPY TREATMENT NOTE    Patient Name: Erika Mullins MRN: [id number] DOB:1975/11/27, 47 y.o., female Today's Date: 05/06/2023  END OF SESSION:  PT End of Session - 05/06/23 0832     Visit Number 3    Number of Visits 17    Date for PT Re-Evaluation 06/11/23    Authorization Type [employer], [id number]    Progress Note Due on Visit 10    PT Start Time 0835    PT Stop Time 0908    PT Time Calculation (min) 33 min    Activity Tolerance Patient tolerated treatment well;No increased pain    Behavior During Therapy WFL for tasks assessed/performed               Past Medical History:  Diagnosis Date   Adult acne    Dysfunctional uterine bleeding    Obesity    Past Surgical History:  Procedure Laterality Date   CARPAL TUNNEL RELEASE  09/2010   Right   TONSILLECTOMY AND ADENOIDECTOMY     TYMPANOSTOMY TUBE PLACEMENT     bilateral   Patient Active Problem List   Diagnosis Date Noted   Plantar fasciitis 11/28/2022   Reactive thrombocytosis 09/17/2021   Heel pain, chronic, left 09/05/2021   Contusion of right knee 09/20/2020   Prediabetes 08/14/2020   Mixed hyperlipidemia 08/10/2020   Seasonal allergies 08/10/2020   Pain in right shoulder 08/07/2020   Carpal tunnel syndrome, bilateral 08/07/2020   H/O carpal tunnel repair 11/04/2018   Routine general medical examination at a health care facility 09/12/2014   Anxiety 09/17/2012   Class 3 severe obesity due to excess calories with body mass index (BMI) of 50.0 to 59.9 in adult (HCC) 06/30/2011    PCP: Deeann Saint, MD  REFERRING PROVIDER: Persons, [place], [place]  REFERRING DIAG: [phone] (ICD-10-CM) - Heel pain, chronic, left  THERAPY DIAG:  Stiffness of left ankle, not elsewhere classified  Pain in left ankle and joints of left foot  Muscle weakness (generalized)  Other abnormalities of gait and mobility  Unsteadiness on feet  Rationale for Evaluation and Treatment:  Rehabilitation  ONSET DATE: several years  SUBJECTIVE:  Per eval - Pt states she was diagnosed with plantar fasciitis and bone spurs. Has had PT in the past with relief. Now having more pain into achilles, less heel pain. Variable by day, notes a limp at times. Seems to vary based on footwear and activity.  Works with a Systems analyst 3x/week, does some stretching for it PRN. Also notes her L foot seems to be getting more flat (over past 8-10 months). No N/T in LE, occasional swelling in foot/ankle when she's up on her feet a lot. Works a lot of events which tend to aggravate it.   SUBJECTIVE STATEMENT: 05/06/2023 ankle is doing pretty well; more sore in the calf    PERTINENT HISTORY: prior carpal tunnel repair, anxiety PAIN:  Are you having pain: none Location/description: L heel and achilles Best-worst over past week: 0-7/10  - aggravating factors: morning pain, prolonged WB, standing/walking (10-76min on average) - Easing factors: rest, footwear    PRECAUTIONS: None  WEIGHT BEARING RESTRICTIONS: No  FALLS:  Has patient fallen in last 6 months? No  LIVING ENVIRONMENT: 2 story home 4STE, 14 steps to bed/bath one rail, with dogs  OCCUPATION: works for city, TEFL teacher   PLOF: Independent  PATIENT GOALS: be able to walk more, be able to more consistently go to gym   NEXT  MD VISIT: TBD   OBJECTIVE: (objective measures completed at initial evaluation unless otherwise dated)   DIAGNOSTIC FINDINGS:  Refer to [facility] for imaging history  PATIENT SURVEYS:  FOTO 61 current, 73 predicted in 10 visits  COGNITION: Overall cognitive status: Within functional limits for tasks assessed     SENSATION: No neuro complaints, appears grossly intact with exam today  EDEMA/OBSERVATION:  No observable edema on eval although she does endorse swelling in foot/ankle at times with increased activity   PALPATION: Concordant tenderness distal L achilles especially at  calcaneal insertion, none throughout plantar fascia, heel, or gastroc soleus  LOWER EXTREMITY ROM:   Active ROM Right eval Left eval  Knee flexion    Knee extension    Ankle dorsiflexion 5 deg Active: lacking 5 deg Passive: neutral with pain   Ankle plantarflexion    Ankle inversion 22 deg  14 deg  Ankle eversion 20 deg  21 deg   (Blank rows = not tested) (Key: WFL = within functional limits not formally assessed, * = concordant pain, s = stiffness/stretching sensation, NT = not tested)  Comments:    LOWER EXTREMITY MMT:  MMT Right eval Left eval  Knee flexion    Knee extension    Ankle dorsiflexion 5 5  Ankle plantarflexion (modified sitting) 5 4+ nonpainful  Ankle inversion 5 5  Ankle eversion 5 5   (Blank rows = not tested) (Key: WFL = within functional limits not formally assessed, * = concordant pain, s = stiffness/stretching sensation, NT = not tested)  Comments:    LOWER EXTREMITY SPECIAL TESTS:  deferred  FUNCTIONAL TESTS:  deferred  GAIT: Distance walked: within clinic Assistive device utilized: None Level of assistance: Complete Independence Comments: noted increase in pronation, mildly widened BOS and increased forward pelvic rotation BIL with LE advancement   TODAY'S TREATMENT:                                                                                                                              [facility] Adult PT Treatment:                                                DATE: 05/06/23 Therapeutic Exercise: Bike x 6 min; L4 Gastroc stretch on incline board 3x30 sec; Lt soleus stretch on incline board 3x30 sec   Manual STM with compression to Lt gastroc/soleus; skilled palpation and monitoring of soft tissue during DN Trigger Point Dry-Needling  Treatment instructions: Expect mild to moderate muscle soreness. S/S of pneumothorax if dry needled over a lung field, and to seek immediate medical attention should they occur. Patient verbalized  understanding of these instructions and education.  Patient Consent Given: Yes Education handout provided: Previously provided (pt has had DN at prior episodes) Muscles treated: Lt soleus/gastroc Electrical stimulation performed: Yes Parameters:  8 mA frequency with intensity to tolerance x 5 min; 2 channels to gastroc/soleus Treatment response/outcome: decreased tightness and improved pain; twitch responses noted    [facility] Adult PT Treatment:                                                DATE: 05/04/23 Therapeutic Exercise: Bike 5 min self paced during subjective Seated heel/toe raises 2x12 cues for setup Seated toe yoga 2x12 cues for tripod Seated toe scrunches 2x12 cues for tripod  Red band inversion/eversion 2x12 each cues for setup  4 inch step up LLE leading in // bars x24; no shoes 2 inch fwd step down // bars 2x12 BIL cues for control; no shoes 3x30sec standing calf stretch LLE only cues for setup and comfortable ROM HEP update + education on appropriate performance    [facility] Adult PT Treatment:                                                DATE: 04/16/23 Therapeutic Exercise: Toe yoga x10 seated Seated heel/toe raises x10 Standing gastroc stretch 3x30sec w UE support at counter HEP handout + education and education on relevant anatomy/physiology as it pertains to exercise   PATIENT EDUCATION:  Education details: rationale for interventions, HEP  Person educated: Patient Education method: Explanation, Demonstration, Tactile cues, Verbal cues, and Handouts Education comprehension: verbalized understanding, returned demonstration, verbal cues required, tactile cues required, and needs further education    HOME EXERCISE PROGRAM: Access Code: [email or web address] URL: [email or web address] Date: 05/04/2023 Prepared by: Fransisco Hertz  Exercises - Toe Yoga - Alternating Great Toe and Lesser Toe Extension  - 2-3 x daily - 7 x weekly - 1 sets - 10 reps - Seated Heel Toe  Raises  - 2-3 x daily - 7 x weekly - 1 sets - 10 reps - Standing Gastroc Stretch  - 2-3 x daily - 7 x weekly - 1 sets - 1-3 reps - 30sec hold - Seated Figure 4 Ankle Inversion with Resistance  - 2-3 x daily - 7 x weekly - 1 sets - 12 reps - Seated Ankle Eversion with Resistance  - 2-3 x daily - 7 x weekly - 1 sets - 12 reps  ASSESSMENT:  CLINICAL IMPRESSION: 05/06/2023 Pt with reduction in pain and tightness following DN today; and overall reporting improvement in symptoms.  Will continue to benefit from PT to maximize function.    Per eval - Pt is a very pleasant 47 year old woman who arrives to PT evaluation on this date for chronic heel pain. Pt reports difficulty primarily with prolonged weightbearing due to pain. During today's session pt demonstrates limitations in ankle mobility and strength, concordant tenderness with palpation of distal achilles. She is also noted to have some increase in pronation on L foot, difficulty with coordination for HEP but improves w/ repetition, nonpainful. These impairments are likely contributing to pain with aforementioned activities, recommend skilled PT to address. No adverse events, pt departs without increase in pain. Pt departs today's session in no acute distress, all voiced questions/concerns addressed appropriately from PT perspective.    OBJECTIVE IMPAIRMENTS: Abnormal gait, decreased activity tolerance, decreased endurance, decreased mobility, difficulty walking, decreased ROM, decreased strength, and pain.  ACTIVITY LIMITATIONS: carrying, standing, squatting, stairs, and locomotion level  PARTICIPATION LIMITATIONS: meal prep, cleaning, laundry, community activity, and occupation  PERSONAL FACTORS: Time since onset of injury/illness/exacerbation and 1 comorbidity: anxiety  are also affecting patient's functional outcome.   REHAB POTENTIAL: Good  CLINICAL DECISION MAKING: Stable/uncomplicated  EVALUATION COMPLEXITY: Low   GOALS: Goals  reviewed with patient? No  SHORT TERM GOALS: Target date: 05/14/2023 Pt will demonstrate appropriate understanding and performance of initially prescribed HEP in order to facilitate improved independence with management of symptoms.  Baseline: HEP provided on eval Goal status: INITIAL   2. Pt will score greater than or equal to 67 on FOTO in order to demonstrate improved perception of function due to symptoms.  Baseline: 61  Goal status: INITIAL    LONG TERM GOALS: Target date: 06/11/2023 Pt will score 73 or greater on FOTO in order to demonstrate improved perception of function due to symptoms.  Baseline: 61 Goal status: INITIAL  2.  Pt will demonstrate at least 5 degrees of L ankle dorsiflexion AROM in order to facilitate improved tolerance to functional movements such as stairs, walking.   Baseline: see ROM chart above Goal status: INITIAL  3.  Pt will report/demonstrate ability to walk/stand up to 1hr with less than 3 pt increase in pain on NPS in order to facilitate improved tolerance to work/community activities.  Baseline: pain after 10-32min on average Goal status: INITIAL  4.  Pt will demonstrate grossly symmetrical ankle MMT in all planes in order to facilitate improved functional strength.  Baseline: see MMT chart above  Goal status: INITIAL  5. Pt will demonstrate appropriate performance of final prescribed HEP in order to facilitate improved self-management of symptoms post-discharge.   Baseline: initial HEP prescribed  Goal status: INITIAL    6. Pt will report at least 50% decrease in overall pain levels in past week in order to facilitate improved tolerance to basic ADLs/mobility.   Baseline: 0-7/10 on NPS  Goal status: INITIAL     PLAN:  PT FREQUENCY: 1-2x/week  PT DURATION: 8 weeks  PLANNED INTERVENTIONS: Therapeutic exercises, Therapeutic activity, Neuromuscular re-education, Balance training, Gait training, Patient/Family education, Self Care, Joint  mobilization, Joint manipulation, Stair training, Aquatic Therapy, Dry Needling, Electrical stimulation, Cryotherapy, Moist heat, Taping, Manual therapy, and Re-evaluation  PLAN FOR NEXT SESSION: assess response to DN and repeat PRN,  Review/update HEP PRN. Work on [employer] exercises as appropriate with emphasis on ankle DF ROM, calf strength, and foot intrinsic/extrinsic coordination in open and closed chain. Symptom modification strategies as indicated/appropriate.     Clarita Crane, PT, DPT 05/06/23 9:12 AM

## 2023-05-11 ENCOUNTER — Telehealth: Payer: Self-pay | Admitting: Physical Therapy

## 2023-05-11 ENCOUNTER — Other Ambulatory Visit: Payer: Self-pay | Admitting: Family Medicine

## 2023-05-11 ENCOUNTER — Encounter: Payer: 59 | Admitting: Physical Therapy

## 2023-05-11 DIAGNOSIS — F419 Anxiety disorder, unspecified: Secondary | ICD-10-CM

## 2023-05-11 NOTE — Telephone Encounter (Signed)
I called pt to follow up after she missed her 8:45 am appointment on 05/11/23. I left a message if she wished to reschedule to call our clinic at [phone]. I also reminded pt of her next appointment which is on Wed 05/13/23 at 3:15.   Narda Amber, PT, MPT 05/11/23 9:30 AM

## 2023-05-13 ENCOUNTER — Encounter: Payer: Self-pay | Admitting: Physical Therapy

## 2023-05-13 ENCOUNTER — Ambulatory Visit: Payer: 59 | Admitting: Physical Therapy

## 2023-05-13 DIAGNOSIS — M6281 Muscle weakness (generalized): Secondary | ICD-10-CM | POA: Diagnosis not present

## 2023-05-13 DIAGNOSIS — M25672 Stiffness of left ankle, not elsewhere classified: Secondary | ICD-10-CM

## 2023-05-13 DIAGNOSIS — M25572 Pain in left ankle and joints of left foot: Secondary | ICD-10-CM | POA: Diagnosis not present

## 2023-05-13 DIAGNOSIS — R2681 Unsteadiness on feet: Secondary | ICD-10-CM

## 2023-05-13 DIAGNOSIS — G8929 Other chronic pain: Secondary | ICD-10-CM

## 2023-05-13 DIAGNOSIS — M25511 Pain in right shoulder: Secondary | ICD-10-CM

## 2023-05-13 NOTE — Therapy (Signed)
OUTPATIENT PHYSICAL THERAPY TREATMENT NOTE    Patient Name: ROLA LENNON MRN: [id number] DOB:1975/12/11, 47 y.o., female Today's Date: 05/13/2023  END OF SESSION:  PT End of Session - 05/13/23 1505     Visit Number 4    Number of Visits 17    Date for PT Re-Evaluation 06/11/23    Authorization Type [employer], [id number]    Progress Note Due on Visit 10    PT Start Time 1503    PT Stop Time 1532    PT Time Calculation (min) 29 min    Activity Tolerance Patient tolerated treatment well;No increased pain    Behavior During Therapy WFL for tasks assessed/performed                Past Medical History:  Diagnosis Date   Adult acne    Dysfunctional uterine bleeding    Obesity    Past Surgical History:  Procedure Laterality Date   CARPAL TUNNEL RELEASE  09/2010   Right   TONSILLECTOMY AND ADENOIDECTOMY     TYMPANOSTOMY TUBE PLACEMENT     bilateral   Patient Active Problem List   Diagnosis Date Noted   Plantar fasciitis 11/28/2022   Reactive thrombocytosis 09/17/2021   Heel pain, chronic, left 09/05/2021   Contusion of right knee 09/20/2020   Prediabetes 08/14/2020   Mixed hyperlipidemia 08/10/2020   Seasonal allergies 08/10/2020   Pain in right shoulder 08/07/2020   Carpal tunnel syndrome, bilateral 08/07/2020   H/O carpal tunnel repair 11/04/2018   Routine general medical examination at a health care facility 09/12/2014   Anxiety 09/17/2012   Class 3 severe obesity due to excess calories with body mass index (BMI) of 50.0 to 59.9 in adult (HCC) 06/30/2011    PCP: Deeann Saint, MD  REFERRING PROVIDER: Persons, [place], [place]  REFERRING DIAG: [phone] (ICD-10-CM) - Heel pain, chronic, left  THERAPY DIAG:  Stiffness of left ankle, not elsewhere classified  Pain in left ankle and joints of left foot  Muscle weakness (generalized)  Unsteadiness on feet  Chronic right shoulder pain  Rationale for Evaluation and Treatment: Rehabilitation  ONSET DATE:  several years  SUBJECTIVE:  Per eval - Pt states she was diagnosed with plantar fasciitis and bone spurs. Has had PT in the past with relief. Now having more pain into achilles, less heel pain. Variable by day, notes a limp at times. Seems to vary based on footwear and activity.  Works with a Systems analyst 3x/week, does some stretching for it PRN. Also notes her L foot seems to be getting more flat (over past 8-10 months). No N/T in LE, occasional swelling in foot/ankle when she's up on her feet a lot. Works a lot of events which tend to aggravate it.   SUBJECTIVE STATEMENT: 05/13/2023 Feels great; no pain except a little this morning.    PERTINENT HISTORY: prior carpal tunnel repair, anxiety PAIN:  Are you having pain: none Location/description: L heel and achilles Best-worst over past week: 0-11/22/08  - aggravating factors: morning pain, prolonged WB, standing/walking (10-9min on average) - Easing factors: rest, footwear    PRECAUTIONS: None  WEIGHT BEARING RESTRICTIONS: No  FALLS:  Has patient fallen in last 6 months? No  LIVING ENVIRONMENT: 2 story home 4STE, 14 steps to bed/bath one rail, with dogs  OCCUPATION: works for city, TEFL teacher   PLOF: Independent  PATIENT GOALS: be able to walk more, be able to more consistently go to gym   NEXT MD VISIT:  TBD   OBJECTIVE: (objective measures completed at initial evaluation unless otherwise dated)   DIAGNOSTIC FINDINGS:  Refer to [facility] for imaging history  PATIENT SURVEYS:  FOTO 61 current, 73 predicted in 10 visits  COGNITION: Overall cognitive status: Within functional limits for tasks assessed     SENSATION: No neuro complaints, appears grossly intact with exam today  EDEMA/OBSERVATION:  No observable edema on eval although she does endorse swelling in foot/ankle at times with increased activity   PALPATION: Concordant tenderness distal L achilles especially at calcaneal insertion, none  throughout plantar fascia, heel, or gastroc soleus  LOWER EXTREMITY ROM:   Active ROM Right eval Left eval  Knee flexion    Knee extension    Ankle dorsiflexion 5 deg Active: lacking 5 deg Passive: neutral with pain   Ankle plantarflexion    Ankle inversion 22 deg  14 deg  Ankle eversion 20 deg  21 deg   (Blank rows = not tested) (Key: WFL = within functional limits not formally assessed, * = concordant pain, s = stiffness/stretching sensation, NT = not tested)  Comments:    LOWER EXTREMITY MMT:  MMT Right eval Left eval  Knee flexion    Knee extension    Ankle dorsiflexion 5 5  Ankle plantarflexion (modified sitting) 5 4+ nonpainful  Ankle inversion 5 5  Ankle eversion 5 5   (Blank rows = not tested) (Key: WFL = within functional limits not formally assessed, * = concordant pain, s = stiffness/stretching sensation, NT = not tested)  Comments:    LOWER EXTREMITY SPECIAL TESTS:  deferred  FUNCTIONAL TESTS:  deferred  GAIT: Distance walked: within clinic Assistive device utilized: None Level of assistance: Complete Independence Comments: noted increase in pronation, mildly widened BOS and increased forward pelvic rotation BIL with LE advancement   TODAY'S TREATMENT:                                                                                                                                [facility] Adult PT Treatment:                                                DATE: 05/13/23 Therapeutic Exercise: Bike x 6 min; L6   Manual STM with compression to Lt gastroc/soleus; skilled palpation and monitoring of soft tissue during DN Trigger Point Dry-Needling  Treatment instructions: Expect mild to moderate muscle soreness. S/S of pneumothorax if dry needled over a lung field, and to seek immediate medical attention should they occur. Patient verbalized understanding of these instructions and education.  Patient Consent Given: Yes Education handout provided: Previously  provided (pt has had DN at prior episodes) Muscles treated: Lt soleus/gastroc Electrical stimulation performed: Yes Parameters: 8 mA frequency with intensity to tolerance x 5 min; single to gastroc/soleus Treatment response/outcome:  decreased tightness and improved pain; twitch responses noted   [facility] Adult PT Treatment:                                                DATE: 05/06/23 Therapeutic Exercise: Bike x 6 min; L4 Gastroc stretch on incline board 3x30 sec; Lt soleus stretch on incline board 3x30 sec   Manual STM with compression to Lt gastroc/soleus; skilled palpation and monitoring of soft tissue during DN Trigger Point Dry-Needling  Treatment instructions: Expect mild to moderate muscle soreness. S/S of pneumothorax if dry needled over a lung field, and to seek immediate medical attention should they occur. Patient verbalized understanding of these instructions and education.  Patient Consent Given: Yes Education handout provided: Previously provided (pt has had DN at prior episodes) Muscles treated: Lt soleus/gastroc Electrical stimulation performed: Yes Parameters:  8 mA frequency with intensity to tolerance x 5 min; 2 channels to gastroc/soleus Treatment response/outcome: decreased tightness and improved pain; twitch responses noted    [facility] Adult PT Treatment:                                                DATE: 05/04/23 Therapeutic Exercise: Bike 5 min self paced during subjective Seated heel/toe raises 2x12 cues for setup Seated toe yoga 2x12 cues for tripod Seated toe scrunches 2x12 cues for tripod  Red band inversion/eversion 2x12 each cues for setup  4 inch step up LLE leading in // bars x24; no shoes 2 inch fwd step down // bars 2x12 BIL cues for control; no shoes 3x30sec standing calf stretch LLE only cues for setup and comfortable ROM HEP update + education on appropriate performance    [facility] Adult PT Treatment:                                                 DATE: 04/16/23 Therapeutic Exercise: Toe yoga x10 seated Seated heel/toe raises x10 Standing gastroc stretch 3x30sec w UE support at counter HEP handout + education and education on relevant anatomy/physiology as it pertains to exercise   PATIENT EDUCATION:  Education details: rationale for interventions, HEP  Person educated: Patient Education method: Explanation, Demonstration, Tactile cues, Verbal cues, and Handouts Education comprehension: verbalized understanding, returned demonstration, verbal cues required, tactile cues required, and needs further education    HOME EXERCISE PROGRAM: Access Code: [email or web address] URL: [email or web address] Date: 05/04/2023 Prepared by: Fransisco Hertz  Exercises - Toe Yoga - Alternating Great Toe and Lesser Toe Extension  - 2-3 x daily - 7 x weekly - 1 sets - 10 reps - Seated Heel Toe Raises  - 2-3 x daily - 7 x weekly - 1 sets - 10 reps - Standing Gastroc Stretch  - 2-3 x daily - 7 x weekly - 1 sets - 1-3 reps - 30sec hold - Seated Figure 4 Ankle Inversion with Resistance  - 2-3 x daily - 7 x weekly - 1 sets - 12 reps - Seated Ankle Eversion with Resistance  - 2-3 x daily - 7 x weekly - 1 sets - 12 reps  ASSESSMENT:  CLINICAL  IMPRESSION: 05/13/2023 Pt overall reporting significant improvement in pain after last session.  Overall doing well and may be ready for d/c or hold next visit.   Per eval - Pt is a very pleasant 47 year old woman who arrives to PT evaluation on this date for chronic heel pain. Pt reports difficulty primarily with prolonged weightbearing due to pain. During today's session pt demonstrates limitations in ankle mobility and strength, concordant tenderness with palpation of distal achilles. She is also noted to have some increase in pronation on L foot, difficulty with coordination for HEP but improves w/ repetition, nonpainful. These impairments are likely contributing to pain with aforementioned activities, recommend  skilled PT to address. No adverse events, pt departs without increase in pain. Pt departs today's session in no acute distress, all voiced questions/concerns addressed appropriately from PT perspective.    OBJECTIVE IMPAIRMENTS: Abnormal gait, decreased activity tolerance, decreased endurance, decreased mobility, difficulty walking, decreased ROM, decreased strength, and pain.   ACTIVITY LIMITATIONS: carrying, standing, squatting, stairs, and locomotion level  PARTICIPATION LIMITATIONS: meal prep, cleaning, laundry, community activity, and occupation  PERSONAL FACTORS: Time since onset of injury/illness/exacerbation and 1 comorbidity: anxiety  are also affecting patient's functional outcome.   REHAB POTENTIAL: Good  CLINICAL DECISION MAKING: Stable/uncomplicated  EVALUATION COMPLEXITY: Low   GOALS: Goals reviewed with patient? No  SHORT TERM GOALS: Target date: 05/14/2023 Pt will demonstrate appropriate understanding and performance of initially prescribed HEP in order to facilitate improved independence with management of symptoms.  Baseline: HEP provided on eval Goal status: INITIAL   2. Pt will score greater than or equal to 67 on FOTO in order to demonstrate improved perception of function due to symptoms.  Baseline: 61  Goal status: INITIAL    LONG TERM GOALS: Target date: 06/11/2023 Pt will score 73 or greater on FOTO in order to demonstrate improved perception of function due to symptoms.  Baseline: 61 Goal status: INITIAL  2.  Pt will demonstrate at least 5 degrees of L ankle dorsiflexion AROM in order to facilitate improved tolerance to functional movements such as stairs, walking.   Baseline: see ROM chart above Goal status: INITIAL  3.  Pt will report/demonstrate ability to walk/stand up to 1hr with less than 3 pt increase in pain on NPS in order to facilitate improved tolerance to work/community activities.  Baseline: pain after 10-57min on average Goal status:  INITIAL  4.  Pt will demonstrate grossly symmetrical ankle MMT in all planes in order to facilitate improved functional strength.  Baseline: see MMT chart above  Goal status: INITIAL  5. Pt will demonstrate appropriate performance of final prescribed HEP in order to facilitate improved self-management of symptoms post-discharge.   Baseline: initial HEP prescribed  Goal status: INITIAL    6. Pt will report at least 50% decrease in overall pain levels in past week in order to facilitate improved tolerance to basic ADLs/mobility.   Baseline: 0-7/10 on NPS  Goal status: INITIAL     PLAN:  PT FREQUENCY: 1-2x/week  PT DURATION: 8 weeks  PLANNED INTERVENTIONS: Therapeutic exercises, Therapeutic activity, Neuromuscular re-education, Balance training, Gait training, Patient/Family education, Self Care, Joint mobilization, Joint manipulation, Stair training, Aquatic Therapy, Dry Needling, Electrical stimulation, Cryotherapy, Moist heat, Taping, Manual therapy, and Re-evaluation  PLAN FOR NEXT SESSION: DN PRN; possible hold or d/c,  Review/update HEP PRN. Work on [employer] exercises as appropriate with emphasis on ankle DF ROM, calf strength, and foot intrinsic/extrinsic coordination in open and closed chain. Symptom modification  strategies as indicated/appropriate.     Clarita Crane, PT, DPT 05/13/23 3:34 PM

## 2023-05-18 ENCOUNTER — Ambulatory Visit: Payer: 59 | Admitting: Physical Therapy

## 2023-05-18 ENCOUNTER — Encounter: Payer: Self-pay | Admitting: Physical Therapy

## 2023-05-18 DIAGNOSIS — M25672 Stiffness of left ankle, not elsewhere classified: Secondary | ICD-10-CM

## 2023-05-18 DIAGNOSIS — M6281 Muscle weakness (generalized): Secondary | ICD-10-CM | POA: Diagnosis not present

## 2023-05-18 DIAGNOSIS — R2681 Unsteadiness on feet: Secondary | ICD-10-CM

## 2023-05-18 DIAGNOSIS — M25572 Pain in left ankle and joints of left foot: Secondary | ICD-10-CM

## 2023-05-18 NOTE — Therapy (Addendum)
OUTPATIENT PHYSICAL THERAPY TREATMENT NOTE / DISCHARGE   Patient Name: Erika Mullins MRN: [id number] DOB:05-08-1976, 47 y.o., female Today's Date: 05/18/2023  END OF SESSION:  PT End of Session - 05/18/23 1512     Visit Number 5    Number of Visits 17    Date for PT Re-Evaluation 06/11/23    Authorization Type [employer], [id number]    Progress Note Due on Visit 10    PT Start Time 1510    PT Stop Time 1544    PT Time Calculation (min) 34 min    Activity Tolerance Patient tolerated treatment well;No increased pain    Behavior During Therapy WFL for tasks assessed/performed                 Past Medical History:  Diagnosis Date   Adult acne    Dysfunctional uterine bleeding    Obesity    Past Surgical History:  Procedure Laterality Date   CARPAL TUNNEL RELEASE  09/2010   Right   TONSILLECTOMY AND ADENOIDECTOMY     TYMPANOSTOMY TUBE PLACEMENT     bilateral   Patient Active Problem List   Diagnosis Date Noted   Plantar fasciitis 11/28/2022   Reactive thrombocytosis 09/17/2021   Heel pain, chronic, left 09/05/2021   Contusion of right knee 09/20/2020   Prediabetes 08/14/2020   Mixed hyperlipidemia 08/10/2020   Seasonal allergies 08/10/2020   Pain in right shoulder 08/07/2020   Carpal tunnel syndrome, bilateral 08/07/2020   H/O carpal tunnel repair 11/04/2018   Routine general medical examination at a health care facility 09/12/2014   Anxiety 09/17/2012   Class 3 severe obesity due to excess calories with body mass index (BMI) of 50.0 to 59.9 in adult (HCC) 06/30/2011    PCP: Deeann Saint, MD  REFERRING PROVIDER: Persons, [place], [place]  REFERRING DIAG: [phone] (ICD-10-CM) - Heel pain, chronic, left  THERAPY DIAG:  Stiffness of left ankle, not elsewhere classified  Pain in left ankle and joints of left foot  Muscle weakness (generalized)  Unsteadiness on feet  Rationale for Evaluation and Treatment: Rehabilitation  ONSET DATE: several  years  SUBJECTIVE:  Per eval - Pt states she was diagnosed with plantar fasciitis and bone spurs. Has had PT in the past with relief. Now having more pain into achilles, less heel pain. Variable by day, notes a limp at times. Seems to vary based on footwear and activity.  Works with a Systems analyst 3x/week, does some stretching for it PRN. Also notes her L foot seems to be getting more flat (over past 8-10 months). No N/T in LE, occasional swelling in foot/ankle when she's up on her feet a lot. Works a lot of events which tend to aggravate it.   SUBJECTIVE STATEMENT: 05/18/2023 Was on her feet all day Saturday and expected Sunday to be pretty uncomfortable but it wasn't; doing really well overall   PERTINENT HISTORY: prior carpal tunnel repair, anxiety PAIN:  Are you having pain: none Location/description: L heel and achilles Best-worst over past week: 0-11/22/08  - aggravating factors: morning pain, prolonged WB, standing/walking (10-28min on average) - Easing factors: rest, footwear    PRECAUTIONS: None  WEIGHT BEARING RESTRICTIONS: No  FALLS:  Has patient fallen in last 6 months? No  LIVING ENVIRONMENT: 2 story home 4STE, 14 steps to bed/bath one rail, with dogs  OCCUPATION: works for city, TEFL teacher   PLOF: Independent  PATIENT GOALS: be able to walk more, be able to more consistently  go to gym   NEXT MD VISIT: TBD   OBJECTIVE: (objective measures completed at initial evaluation unless otherwise dated)   DIAGNOSTIC FINDINGS:  Refer to [facility] for imaging history  PATIENT SURVEYS:  EVAL: FOTO 61 current, 73 predicted in 10 visits 05/18/23: FOTO 75  COGNITION: Overall cognitive status: Within functional limits for tasks assessed     SENSATION: No neuro complaints, appears grossly intact with exam today  EDEMA/OBSERVATION:  No observable edema on eval although she does endorse swelling in foot/ankle at times with increased activity    PALPATION: Concordant tenderness distal L achilles especially at calcaneal insertion, none throughout plantar fascia, heel, or gastroc soleus  LOWER EXTREMITY ROM:   Active ROM Right eval Left eval Left 05/18/23  Knee flexion     Knee extension     Ankle dorsiflexion 5 deg Active: lacking 5 deg Passive: neutral with pain  12 in  standing  Ankle plantarflexion     Ankle inversion 22 deg  14 deg   Ankle eversion 20 deg  21 deg    (Blank rows = not tested) (Key: WFL = within functional limits not formally assessed, * = concordant pain, s = stiffness/stretching sensation, NT = not tested)  Comments:    LOWER EXTREMITY MMT:  MMT Right eval Left eval LEft 05/18/23  Knee flexion     Knee extension     Ankle dorsiflexion 5 5 5   Ankle plantarflexion (modified sitting) 5 4+ nonpainful 5  Ankle inversion 5 5 5   Ankle eversion 5 5 5    (Blank rows = not tested) (Key: WFL = within functional limits not formally assessed, * = concordant pain, s = stiffness/stretching sensation, NT = not tested)  Comments:    LOWER EXTREMITY SPECIAL TESTS:  deferred  FUNCTIONAL TESTS:  deferred  GAIT: Distance walked: within clinic Assistive device utilized: None Level of assistance: Complete Independence Comments: noted increase in pronation, mildly widened BOS and increased forward pelvic rotation BIL with LE advancement   TODAY'S TREATMENT:                                                                                                                              DATE: 05/18/23 Therapeutic Exercise: Bike x 6 min; L6 MMT and ROM - see above  Manual STM with compression to Lt gastroc/soleus; skilled palpation and monitoring of soft tissue during DN Trigger Point Dry-Needling  Treatment instructions: Expect mild to moderate muscle soreness. S/S of pneumothorax if dry needled over a lung field, and to seek immediate medical attention should they occur. Patient verbalized understanding of  these instructions and education.  Patient Consent Given: Yes Education handout provided: Previously provided (pt has had DN at prior episodes) Muscles treated: Lt soleus/gastroc Electrical stimulation performed: Yes Parameters: 8 mA frequency with intensity to tolerance x 5 min; single to gastroc/soleus Treatment response/outcome: decreased tightness and improved pain; twitch responses noted  05/13/23 Therapeutic Exercise: Bike x 6 min; L6  Manual STM with compression to Lt gastroc/soleus; skilled palpation and monitoring of soft tissue during DN Trigger Point Dry-Needling  Treatment instructions: Expect mild to moderate muscle soreness. S/S of pneumothorax if dry needled over a lung field, and to seek immediate medical attention should they occur. Patient verbalized understanding of these instructions and education.  Patient Consent Given: Yes Education handout provided: Previously provided (pt has had DN at prior episodes) Muscles treated: Lt soleus/gastroc Electrical stimulation performed: Yes Parameters: 8 mA frequency with intensity to tolerance x 5 min; single to gastroc/soleus Treatment response/outcome: decreased tightness and improved pain; twitch responses noted   05/06/23 Therapeutic Exercise: Bike x 6 min; L4 Gastroc stretch on incline board 3x30 sec; Lt soleus stretch on incline board 3x30 sec   Manual STM with compression to Lt gastroc/soleus; skilled palpation and monitoring of soft tissue during DN Trigger Point Dry-Needling  Treatment instructions: Expect mild to moderate muscle soreness. S/S of pneumothorax if dry needled over a lung field, and to seek immediate medical attention should they occur. Patient verbalized understanding of these instructions and education.  Patient Consent Given: Yes Education handout provided: Previously provided (pt has had DN at prior episodes) Muscles treated: Lt soleus/gastroc Electrical stimulation performed:  Yes Parameters:  8 mA frequency with intensity to tolerance x 5 min; 2 channels to gastroc/soleus Treatment response/outcome: decreased tightness and improved pain; twitch responses noted  PATIENT EDUCATION:  Education details: rationale for interventions, HEP  Person educated: Patient Education method: Explanation, Demonstration, Tactile cues, Verbal cues, and Handouts Education comprehension: verbalized understanding, returned demonstration, verbal cues required, tactile cues required, and needs further education    HOME EXERCISE PROGRAM: Access Code: [email or web address] URL: [email or web address] Date: 05/04/2023 Prepared by: Fransisco Hertz  Exercises - Toe Yoga - Alternating Great Toe and Lesser Toe Extension  - 2-3 x daily - 7 x weekly - 1 sets - 10 reps - Seated Heel Toe Raises  - 2-3 x daily - 7 x weekly - 1 sets - 10 reps - Standing Gastroc Stretch  - 2-3 x daily - 7 x weekly - 1 sets - 1-3 reps - 30sec hold - Seated Figure 4 Ankle Inversion with Resistance  - 2-3 x daily - 7 x weekly - 1 sets - 12 reps - Seated Ankle Eversion with Resistance  - 2-3 x daily - 7 x weekly - 1 sets - 12 reps  ASSESSMENT:  CLINICAL IMPRESSION: 05/18/2023  Pt has met all goals at this time.  Will hold PT, pt to return if symptoms return.  Per eval - Pt is a very pleasant 47 year old woman who arrives to PT evaluation on this date for chronic heel pain. Pt reports difficulty primarily with prolonged weightbearing due to pain. During today's session pt demonstrates limitations in ankle mobility and strength, concordant tenderness with palpation of distal achilles. She is also noted to have some increase in pronation on L foot, difficulty with coordination for HEP but improves w/ repetition, nonpainful. These impairments are likely contributing to pain with aforementioned activities, recommend skilled PT to address. No adverse events, pt departs without increase in pain. Pt departs today's session in no  acute distress, all voiced questions/concerns addressed appropriately from PT perspective.    OBJECTIVE IMPAIRMENTS: Abnormal gait, decreased activity tolerance, decreased endurance, decreased mobility, difficulty walking, decreased ROM, decreased strength, and pain.   ACTIVITY LIMITATIONS: carrying, standing, squatting, stairs, and locomotion level  PARTICIPATION LIMITATIONS: meal prep, cleaning, laundry, community activity, and occupation  PERSONAL FACTORS: Time since  onset of injury/illness/exacerbation and 1 comorbidity: anxiety  are also affecting patient's functional outcome.   REHAB POTENTIAL: Good  CLINICAL DECISION MAKING: Stable/uncomplicated  EVALUATION COMPLEXITY: Low   GOALS: Goals reviewed with patient? No  SHORT TERM GOALS: Target date: 05/14/2023 Pt will demonstrate appropriate understanding and performance of initially prescribed HEP in order to facilitate improved independence with management of symptoms.  Baseline: HEP provided on eval Goal status: MET 05/18/23   2. Pt will score greater than or equal to 67 on FOTO in order to demonstrate improved perception of function due to symptoms.  Baseline: 61  Goal status: MET 05/18/23   LONG TERM GOALS: Target date: 06/11/2023 Pt will score 73 or greater on FOTO in order to demonstrate improved perception of function due to symptoms.  Baseline: 61 Goal status: MET 05/18/23  2.  Pt will demonstrate at least 5 degrees of L ankle dorsiflexion AROM in order to facilitate improved tolerance to functional movements such as stairs, walking.   Baseline: see ROM chart above Goal status:  MET 05/18/23  3.  Pt will report/demonstrate ability to walk/stand up to 1hr with less than 3 pt increase in pain on NPS in order to facilitate improved tolerance to work/community activities.  Baseline: pain after 10-34min on average Goal status: MET 05/18/23  4.  Pt will demonstrate grossly symmetrical ankle MMT in all planes in order to  facilitate improved functional strength.  Baseline: see MMT chart above  Goal status:  MET 05/18/23  5. Pt will demonstrate appropriate performance of final prescribed HEP in order to facilitate improved self-management of symptoms post-discharge.   Baseline: initial HEP prescribed  Goal status: MET 05/18/23  6. Pt will report at least 50% decrease in overall pain levels in past week in order to facilitate improved tolerance to basic ADLs/mobility.   Baseline: 0-7/10 on NPS  Goal status: MET 05/18/23     PLAN:  PT FREQUENCY: 1-2x/week  PT DURATION: 8 weeks  PLANNED INTERVENTIONS: Therapeutic exercises, Therapeutic activity, Neuromuscular re-education, Balance training, Gait training, Patient/Family education, Self Care, Joint mobilization, Joint manipulation, Stair training, Aquatic Therapy, Dry Needling, Electrical stimulation, Cryotherapy, Moist heat, Taping, Manual therapy, and Re-evaluation  PLAN FOR NEXT SESSION: hold PT   Clarita Crane, PT, DPT 05/18/23 3:44 PM   PHYSICAL THERAPY DISCHARGE SUMMARY  Visits from Start of Care: 5  Current functional level related to goals / functional outcomes: See note   Remaining deficits: See note   Education / Equipment: HEP  Patient goals were met. Patient is being discharged due to not returning since the last visit.  Chyrel Masson, PT, DPT, OCS, ATC 06/23/23  11:13 AM

## 2023-05-20 ENCOUNTER — Encounter: Payer: 59 | Admitting: Physical Therapy

## 2023-05-20 ENCOUNTER — Other Ambulatory Visit: Payer: Self-pay | Admitting: Family Medicine

## 2023-05-20 DIAGNOSIS — Z3041 Encounter for surveillance of contraceptive pills: Secondary | ICD-10-CM

## 2023-06-04 ENCOUNTER — Encounter (INDEPENDENT_AMBULATORY_CARE_PROVIDER_SITE_OTHER): Payer: Self-pay

## 2023-06-25 ENCOUNTER — Encounter: Payer: 59 | Admitting: Family Medicine

## 2023-07-21 ENCOUNTER — Encounter: Payer: Self-pay | Admitting: Family Medicine

## 2023-07-21 DIAGNOSIS — L578 Other skin changes due to chronic exposure to nonionizing radiation: Secondary | ICD-10-CM | POA: Insufficient documentation

## 2023-07-21 HISTORY — DX: Other skin changes due to chronic exposure to nonionizing radiation: L57.8

## 2023-07-22 ENCOUNTER — Encounter: Payer: 59 | Admitting: Family Medicine

## 2023-07-22 DIAGNOSIS — R7303 Prediabetes: Secondary | ICD-10-CM

## 2023-07-22 DIAGNOSIS — Z0000 Encounter for general adult medical examination without abnormal findings: Secondary | ICD-10-CM

## 2023-07-22 DIAGNOSIS — E559 Vitamin D deficiency, unspecified: Secondary | ICD-10-CM

## 2023-07-22 DIAGNOSIS — E538 Deficiency of other specified B group vitamins: Secondary | ICD-10-CM

## 2023-07-22 DIAGNOSIS — E782 Mixed hyperlipidemia: Secondary | ICD-10-CM

## 2023-07-26 ENCOUNTER — Other Ambulatory Visit: Payer: Self-pay | Admitting: Family Medicine

## 2023-07-26 DIAGNOSIS — F419 Anxiety disorder, unspecified: Secondary | ICD-10-CM

## 2023-08-13 ENCOUNTER — Other Ambulatory Visit: Payer: Self-pay | Admitting: Family Medicine

## 2023-08-13 DIAGNOSIS — Z3041 Encounter for surveillance of contraceptive pills: Secondary | ICD-10-CM

## 2023-08-19 ENCOUNTER — Other Ambulatory Visit: Payer: Self-pay | Admitting: Family Medicine

## 2023-08-19 DIAGNOSIS — L304 Erythema intertrigo: Secondary | ICD-10-CM

## 2023-08-21 ENCOUNTER — Encounter: Payer: 59 | Admitting: Family Medicine

## 2023-09-02 ENCOUNTER — Ambulatory Visit (INDEPENDENT_AMBULATORY_CARE_PROVIDER_SITE_OTHER): Payer: 59 | Admitting: Family Medicine

## 2023-09-02 VITALS — BP 140/80 | HR 79 | Temp 98.4°F | Ht 68.5 in | Wt 346.2 lb

## 2023-09-02 DIAGNOSIS — E538 Deficiency of other specified B group vitamins: Secondary | ICD-10-CM

## 2023-09-02 DIAGNOSIS — Z0000 Encounter for general adult medical examination without abnormal findings: Secondary | ICD-10-CM | POA: Diagnosis not present

## 2023-09-02 DIAGNOSIS — Z23 Encounter for immunization: Secondary | ICD-10-CM

## 2023-09-02 DIAGNOSIS — L304 Erythema intertrigo: Secondary | ICD-10-CM

## 2023-09-02 DIAGNOSIS — R7303 Prediabetes: Secondary | ICD-10-CM | POA: Diagnosis not present

## 2023-09-02 DIAGNOSIS — R5383 Other fatigue: Secondary | ICD-10-CM

## 2023-09-02 DIAGNOSIS — J302 Other seasonal allergic rhinitis: Secondary | ICD-10-CM

## 2023-09-02 DIAGNOSIS — Z1211 Encounter for screening for malignant neoplasm of colon: Secondary | ICD-10-CM

## 2023-09-02 LAB — LIPID PANEL
Cholesterol: 242 mg/dL — ABNORMAL HIGH (ref 0–200)
HDL: 46.1 mg/dL (ref >39.00)
LDL Cholesterol: 145 mg/dL — ABNORMAL HIGH (ref 0–99)
NonHDL: 196.34
Total CHOL/HDL Ratio: 5
Triglycerides: 258 mg/dL — ABNORMAL HIGH (ref 0.0–149.0)
VLDL: 51.6 mg/dL — ABNORMAL HIGH (ref 0.0–40.0)

## 2023-09-02 LAB — VITAMIN D 25 HYDROXY (VIT D DEFICIENCY, FRACTURES): VITD: 30.14 ng/mL (ref 30.00–100.00)

## 2023-09-02 LAB — VITAMIN B12: Vitamin B-12: 442 pg/mL (ref 211–911)

## 2023-09-02 LAB — CBC WITH DIFFERENTIAL/PLATELET
Basophils Absolute: 0.1 10*3/uL (ref 0.0–0.1)
Basophils Relative: 0.5 % (ref 0.0–3.0)
Eosinophils Absolute: 0.3 10*3/uL (ref 0.0–0.7)
Eosinophils Relative: 2 % (ref 0.0–5.0)
HCT: 42.7 % (ref 36.0–46.0)
Hemoglobin: 14.2 g/dL (ref 12.0–15.0)
Lymphocytes Relative: 32.8 % (ref 12.0–46.0)
Lymphs Abs: 5.2 10*3/uL — ABNORMAL HIGH (ref 0.7–4.0)
MCHC: 33.2 g/dL (ref 30.0–36.0)
MCV: 87.2 fL (ref 78.0–100.0)
Monocytes Absolute: 0.9 10*3/uL (ref 0.1–1.0)
Monocytes Relative: 5.4 % (ref 3.0–12.0)
Neutro Abs: 9.4 10*3/uL — ABNORMAL HIGH (ref 1.4–7.7)
Neutrophils Relative %: 59.3 % (ref 43.0–77.0)
Platelets: 585 10*3/uL — ABNORMAL HIGH (ref 150.0–400.0)
RBC: 4.9 Mil/uL (ref 3.87–5.11)
RDW: 12.8 % (ref 11.5–15.5)
WBC: 15.9 10*3/uL — ABNORMAL HIGH (ref 4.0–10.5)

## 2023-09-02 LAB — COMPREHENSIVE METABOLIC PANEL
ALT: 10 U/L (ref 0–35)
AST: 13 U/L (ref 0–37)
Albumin: 4.1 g/dL (ref 3.5–5.2)
Alkaline Phosphatase: 91 U/L (ref 39–117)
BUN: 7 mg/dL (ref 6–23)
CO2: 27 meq/L (ref 19–32)
Calcium: 9.1 mg/dL (ref 8.4–10.5)
Chloride: 99 meq/L (ref 96–112)
Creatinine, Ser: 0.69 mg/dL (ref 0.40–1.20)
GFR: 104.53 mL/min (ref >60.00)
Glucose, Bld: 101 mg/dL — ABNORMAL HIGH (ref 70–99)
Potassium: 3.9 meq/L (ref 3.5–5.1)
Sodium: 134 meq/L — ABNORMAL LOW (ref 135–145)
Total Bilirubin: 0.5 mg/dL (ref 0.2–1.2)
Total Protein: 7.1 g/dL (ref 6.0–8.3)

## 2023-09-02 LAB — TSH: TSH: 1.86 u[IU]/mL (ref 0.35–5.50)

## 2023-09-02 LAB — HEMOGLOBIN A1C: Hgb A1c MFr Bld: 6.7 % — ABNORMAL HIGH (ref 4.6–6.5)

## 2023-09-02 LAB — T4, FREE: Free T4: 0.78 ng/dL (ref 0.60–1.60)

## 2023-09-02 MED ORDER — NYSTATIN 100000 UNIT/GM EX CREA
1.0000 | TOPICAL_CREAM | Freq: Two times a day (BID) | CUTANEOUS | 0 refills | Status: AC
Start: 2023-09-02 — End: ?

## 2023-09-02 MED ORDER — MONTELUKAST SODIUM 10 MG PO TABS: 10.0000 mg | ORAL_TABLET | Freq: Every day | ORAL | 3 refills | Status: DC

## 2023-09-02 NOTE — Progress Notes (Signed)
Established Patient Office Visit   Subjective  Patient ID: Erika Mullins, female    DOB: [birth year]  Age: 47 y.o. MRN: [id number]  Chief Complaint  Patient presents with   Annual Exam    Patient is a 47 year old female seen for CPE.  Patient states she has been doing well overall.  Working on weight loss.  Exercising regularly.  States needs to incorporate cardio and cook more at home.  Seen by PT for history of plantars fasciitis and left foot and left lower extremity.  Now with left heel spur aggravating left Achilles tendon patient notes increased brain fog.  Patient requesting refills.  Using nystatin cream as needed for intertrigo.    Patient Active Problem List   Diagnosis Date Noted   Plantar fasciitis 11/28/2022   Reactive thrombocytosis 09/17/2021   Heel pain, chronic, left 09/05/2021   Contusion of right knee 09/20/2020   Prediabetes 08/14/2020   Mixed hyperlipidemia 08/10/2020   Seasonal allergies 08/10/2020   Pain in right shoulder 08/07/2020   Carpal tunnel syndrome, bilateral 08/07/2020   H/O carpal tunnel repair 11/04/2018   Nasal injury 05/31/2018   Routine general medical examination at a health care facility 09/12/2014   Anxiety 09/17/2012   Class 3 severe obesity due to excess calories with body mass index (BMI) of 50.0 to 59.9 in adult [facility]) 06/30/2011   Past Medical History:  Diagnosis Date   Adult acne    Dermatitis due to solar radiation 07/21/2023   Dysfunctional uterine bleeding    Obesity    Past Surgical History:  Procedure Laterality Date   CARPAL TUNNEL RELEASE  09/2010   Right   TONSILLECTOMY AND ADENOIDECTOMY     TYMPANOSTOMY TUBE PLACEMENT     bilateral   Social History   Tobacco Use   Smoking status: Never   Smokeless tobacco: Never  Substance Use Topics   Alcohol use: Yes    Comment: large glass of wine 3-4 nights/week   Drug use: No   Family History  Problem Relation Age of Onset   Hyperlipidemia Father    Heart  disease Father    Obesity Father    Cancer Maternal Grandfather        Pancreatic   Hyperlipidemia Maternal Grandfather    Heart attack Maternal Grandfather    Hyperlipidemia Other    Hypertension Other    Obesity Other    Stroke Maternal Grandmother    Breast cancer Neg Hx    Allergies  Allergen Reactions   Doxycycline Hives and Other (See Comments)   Nickel Hives and Other (See Comments)      ROS Negative unless stated above    Objective:     BP (!) 140/82 (BP Location: Left Arm, Patient Position: Sitting, Cuff Size: Large)   Pulse 79   Temp 98.4 F (36.9 C) (Oral)   Ht 5' 8.5" (1.74 m)   Wt (!) 346 lb 3.2 oz (157 kg)   LMP 08/19/2023 (Exact Date)   SpO2 94%   BMI 51.87 kg/m  BP Readings from Last 3 Encounters:  09/02/23 (!) 140/82  07/11/22 124/70  11/28/21 140/80   Wt Readings from Last 3 Encounters:  09/02/23 (!) 346 lb 3.2 oz (157 kg)  07/11/22 (!) 339 lb 3.2 oz (153.9 kg)  11/28/21 (!) 333 lb 6.4 oz (151.2 kg)      Physical Exam Constitutional:      Appearance: Normal appearance.  HENT:     Head: Normocephalic and atraumatic.  Right Ear: Tympanic membrane, ear canal and external ear normal.     Left Ear: Tympanic membrane, ear canal and external ear normal.     Nose: Nose normal.     Mouth/Throat:     Mouth: Mucous membranes are moist.     Pharynx: No oropharyngeal exudate or posterior oropharyngeal erythema.  Eyes:     General: No scleral icterus.    Extraocular Movements: Extraocular movements intact.     Conjunctiva/sclera: Conjunctivae normal.     Pupils: Pupils are equal, round, and reactive to light.  Neck:     Thyroid: No thyromegaly.  Cardiovascular:     Rate and Rhythm: Normal rate and regular rhythm.     Pulses: Normal pulses.     Heart sounds: Normal heart sounds. No murmur heard.    No friction rub.  Pulmonary:     Effort: Pulmonary effort is normal.     Breath sounds: Normal breath sounds. No wheezing, rhonchi or rales.   Abdominal:     General: Bowel sounds are normal.     Palpations: Abdomen is soft.     Tenderness: There is no abdominal tenderness.  Musculoskeletal:        General: No deformity. Normal range of motion.  Lymphadenopathy:     Cervical: No cervical adenopathy.  Skin:    General: Skin is warm and dry.     Findings: No lesion.  Neurological:     General: No focal deficit present.     Mental Status: She is alert and oriented to person, place, and time.  Psychiatric:        Mood and Affect: Mood normal.        Thought Content: Thought content normal.       09/02/2023    1:22 PM 07/11/2022   10:17 AM 11/28/2021   10:41 AM  Depression screen PHQ 2/9  Decreased Interest 0 0 0  Down, Depressed, Hopeless 0 0 0  PHQ - 2 Score 0 0 0  Altered sleeping 1 0   Tired, decreased energy 2 1   Change in appetite 0 0   Feeling bad or failure about yourself  0 0   Trouble concentrating 0 0   Moving slowly or fidgety/restless 0 0   Suicidal thoughts 0 0   PHQ-9 Score 3 1   Difficult doing work/chores Not difficult at all Not difficult at all       09/02/2023    1:22 PM 08/21/2021   11:10 AM 08/21/2021   10:34 AM 08/13/2020    9:25 AM  GAD 7 : Generalized Anxiety Score  Nervous, Anxious, on Edge 0 0 0 0  Control/stop worrying 0 0 0 0  Worry too much - different things 0 0 0 0  Trouble relaxing 0 0 0 0  Restless 0 0 0 0  Easily annoyed or irritable 0 0 0 0  Afraid - awful might happen 0 0 0 0  Total GAD 7 Score 0 0 0 0  Anxiety Difficulty Not difficult at all   Not difficult at all      No results found for any visits on 09/02/23.    Assessment & Plan:  Encounter for preventative adult health care examination -Age-appropriate health screenings discussed. -Will obtain labs -Immunizations reviewed.  Influenza and Tdap vaccines given this visit -Mammogram done 03/25/2023 -Colonoscopy ordered this visit -Pap done 08/13/2020.  Repeat due in 2026. -Recheck BPas elevated -Next CPE in  1 year -  CBC with Differential/Platelet; Future -     Comprehensive metabolic panel; Future -     Lipid panel; Future -     TSH; Future -     Hemoglobin A1c; Future  B12 deficiency -     CBC with Differential/Platelet; Future -     Vitamin B12; Future  Intertrigo -     Nystatin; Apply 1 Application topically 2 (two) times daily.  Dispense: 30 g; Refill: 0  Seasonal allergies -     Montelukast Sodium; Take 1 tablet (10 mg total) by mouth at bedtime.  Dispense: 30 tablet; Refill: 3  Colon cancer screening -     Ambulatory referral to Gastroenterology  Need for influenza vaccination -     Flu vaccine trivalent PF, 6mos and older(Flulaval,Afluria,Fluarix,Fluzone)  Need for Tdap vaccination -     Tdap vaccine greater than or equal to 7yo IM  Fatigue, unspecified type -Discussed possible causes including vitamin or electrolyte deficiency, anemia, perimenopause, etc. -     CBC with Differential/Platelet; Future -     Comprehensive metabolic panel; Future -     TSH; Future -     Vitamin B12; Future -     VITAMIN D 25 Hydroxy (Vit-D Deficiency, Fractures); Future -     Hemoglobin A1c; Future -     T4, free -     Iron, TIBC and Ferritin Panel  Prediabetes -     Hemoglobin A1c; Future  Morbid obesity (HCC) -Body mass index is 51.87 kg/m. -Continue lifestyle modifications. -Patient encouraged to start cooking more at home and incorporating cardio -Discussed weight loss medication options.  Patient wishes to wait at this time -Consider referral to weight management  Return in about 3 months (around 12/03/2023) for weight.   Deeann Saint, MD

## 2023-09-03 LAB — IRON,TIBC AND FERRITIN PANEL
%SAT: 25 % (ref 16–45)
Ferritin: 36 ng/mL (ref 16–232)
Iron: 104 ug/dL (ref 40–190)
TIBC: 410 ug/dL (ref 250–450)

## 2023-09-05 ENCOUNTER — Other Ambulatory Visit: Payer: Self-pay | Admitting: Family Medicine

## 2023-09-05 DIAGNOSIS — F419 Anxiety disorder, unspecified: Secondary | ICD-10-CM

## 2023-11-05 ENCOUNTER — Encounter: Payer: Self-pay | Admitting: Family Medicine

## 2023-11-09 NOTE — Telephone Encounter (Signed)
Patient should be able to pick up remaining refill of citalopram from the pharmacy.

## 2024-01-01 ENCOUNTER — Other Ambulatory Visit: Payer: Self-pay | Admitting: Family Medicine

## 2024-01-01 DIAGNOSIS — F419 Anxiety disorder, unspecified: Secondary | ICD-10-CM

## 2024-01-07 ENCOUNTER — Encounter: Payer: Self-pay | Admitting: Family Medicine

## 2024-01-07 ENCOUNTER — Ambulatory Visit: Admitting: Family Medicine

## 2024-01-07 VITALS — BP 150/84 | HR 89 | Temp 98.3°F | Ht 68.5 in | Wt 341.6 lb

## 2024-01-07 DIAGNOSIS — F419 Anxiety disorder, unspecified: Secondary | ICD-10-CM

## 2024-01-07 DIAGNOSIS — Z1211 Encounter for screening for malignant neoplasm of colon: Secondary | ICD-10-CM | POA: Diagnosis not present

## 2024-01-07 DIAGNOSIS — R030 Elevated blood-pressure reading, without diagnosis of hypertension: Secondary | ICD-10-CM | POA: Diagnosis not present

## 2024-01-07 DIAGNOSIS — R7303 Prediabetes: Secondary | ICD-10-CM

## 2024-01-07 DIAGNOSIS — Z3041 Encounter for surveillance of contraceptive pills: Secondary | ICD-10-CM | POA: Diagnosis not present

## 2024-01-07 MED ORDER — CITALOPRAM HYDROBROMIDE 40 MG PO TABS
40.0000 mg | ORAL_TABLET | Freq: Every day | ORAL | 3 refills | Status: AC
  Filled 2024-02-02: qty 30, 30d supply, fill #0
  Filled 2024-03-29: qty 30, 30d supply, fill #1
  Filled 2024-04-21 – 2024-05-14 (×3): qty 30, 30d supply, fill #2
  Filled 2024-06-08 – 2024-06-22 (×2): qty 30, 30d supply, fill #3
  Filled 2024-07-20 – 2024-08-03 (×2): qty 30, 30d supply, fill #4
  Filled 2024-08-26 – 2024-09-13 (×3): qty 30, 30d supply, fill #5

## 2024-01-07 MED ORDER — NORTREL 7/7/7 0.5/0.75/1-35 MG-MCG PO TABS
1.0000 | ORAL_TABLET | Freq: Every day | ORAL | 3 refills | Status: AC
  Filled 2024-02-02 – 2024-09-13 (×3): qty 84, 84d supply, fill #0

## 2024-01-07 NOTE — Patient Instructions (Signed)
 Get a blood pressure monitor and start monitoring your blood pressure at home.  If you notice that readings are consistently greater than 140/90 (either number) notify clinic so that we can talk about medication options.  In the meantime decrease your sodium intake by reading labels.

## 2024-01-07 NOTE — Progress Notes (Signed)
 Established Patient Office Visit   Subjective  Patient ID: Erika Mullins, female    DOB: [birth year]  Age: 48 y.o. MRN: [id number]  Chief Complaint  Patient presents with   Follow-up    3 month Medication follow up     Patient is a 48 year old female seen for follow-up.  Patient doing well overall.  Requesting refill on Celexa and OCPs.  Sleep, energy, mood are good.  Would like to proceed with scheduling colonoscopy.  Exercising 3 times a week with a trainer.  Plans to work on meal prepping/eating out less to help with weight loss.  BP elevated this visit.  Patient states she was rushing to the office.  Does not have a BP cuff at home.  Taking vitamin D and B12 supplements.    Patient Active Problem List   Diagnosis Date Noted   Plantar fasciitis 11/28/2022   Reactive thrombocytosis 09/17/2021   Heel pain, chronic, left 09/05/2021   Contusion of right knee 09/20/2020   Prediabetes 08/14/2020   Mixed hyperlipidemia 08/10/2020   Seasonal allergies 08/10/2020   Pain in right shoulder 08/07/2020   Carpal tunnel syndrome, bilateral 08/07/2020   H/O carpal tunnel repair 11/04/2018   Nasal injury 05/31/2018   Routine general medical examination at a health care facility 09/12/2014   Anxiety 09/17/2012   Class 3 severe obesity due to excess calories with body mass index (BMI) of 50.0 to 59.9 in adult [facility]) 06/30/2011   Past Medical History:  Diagnosis Date   Adult acne    Dermatitis due to solar radiation 07/21/2023   Dysfunctional uterine bleeding    Obesity    Past Surgical History:  Procedure Laterality Date   CARPAL TUNNEL RELEASE  09/2010   Right   TONSILLECTOMY AND ADENOIDECTOMY     TYMPANOSTOMY TUBE PLACEMENT     bilateral   Social History   Tobacco Use   Smoking status: Never   Smokeless tobacco: Never  Substance Use Topics   Alcohol use: Yes    Comment: large glass of wine 3-4 nights/week   Drug use: No   Family History  Problem Relation Age of Onset    Hyperlipidemia Father    Heart disease Father    Obesity Father    Cancer Maternal Grandfather        Pancreatic   Hyperlipidemia Maternal Grandfather    Heart attack Maternal Grandfather    Hyperlipidemia Other    Hypertension Other    Obesity Other    Stroke Maternal Grandmother    Breast cancer Neg Hx    Allergies  Allergen Reactions   Doxycycline Hives and Other (See Comments)   Nickel Hives and Other (See Comments)      ROS Negative unless stated above    Objective:     BP (!) 162/100 (BP Location: Left Arm, Patient Position: Sitting, Cuff Size: Normal)   Pulse 89   Temp 98.3 F (36.8 C) (Oral)   Ht 5' 8.5" (1.74 m)   Wt (!) 341 lb 9.6 oz (154.9 kg)   LMP 12/29/2023 (Approximate)   SpO2 97%   BMI 51.18 kg/m  BP Readings from Last 3 Encounters:  01/07/24 (!) 162/100  09/02/23 (!) 140/80  07/11/22 124/70   Wt Readings from Last 3 Encounters:  01/07/24 (!) 341 lb 9.6 oz (154.9 kg)  09/02/23 (!) 346 lb 3.2 oz (157 kg)  07/11/22 (!) 339 lb 3.2 oz (153.9 kg)      Physical Exam Constitutional:  General: She is not in acute distress.    Appearance: Normal appearance.  HENT:     Head: Normocephalic and atraumatic.     Nose: Nose normal.     Mouth/Throat:     Mouth: Mucous membranes are moist.  Cardiovascular:     Rate and Rhythm: Normal rate and regular rhythm.     Heart sounds: Normal heart sounds. No murmur heard.    No gallop.  Pulmonary:     Effort: Pulmonary effort is normal. No respiratory distress.     Breath sounds: Normal breath sounds. No wheezing, rhonchi or rales.  Skin:    General: Skin is warm and dry.  Neurological:     Mental Status: She is alert and oriented to person, place, and time.       01/07/2024    1:39 PM 09/02/2023    1:22 PM 07/11/2022   10:17 AM  Depression screen PHQ 2/9  Decreased Interest 0 0 0  Down, Depressed, Hopeless 0 0 0  PHQ - 2 Score 0 0 0  Altered sleeping 1 1 0  Tired, decreased energy 1 2 1    Change in appetite 0 0 0  Feeling bad or failure about yourself  0 0 0  Trouble concentrating 0 0 0  Moving slowly or fidgety/restless 0 0 0  Suicidal thoughts 0 0 0  PHQ-9 Score 2 3 1   Difficult doing work/chores Not difficult at all Not difficult at all Not difficult at all      01/07/2024    1:40 PM 09/02/2023    1:22 PM 08/21/2021   11:10 AM 08/21/2021   10:34 AM  GAD 7 : Generalized Anxiety Score  Nervous, Anxious, on Edge 0 0 0 0  Control/stop worrying 0 0 0 0  Worry too much - different things 0 0 0 0  Trouble relaxing 0 0 0 0  Restless 0 0 0 0  Easily annoyed or irritable 0 0 0 0  Afraid - awful might happen 0 0 0 0  Total GAD 7 Score 0 0 0 0  Anxiety Difficulty Not difficult at all Not difficult at all      No results found for any visits on 01/07/24.    Assessment & Plan:  Elevated blood pressure reading in office without diagnosis of hypertension  Morbid obesity (HCC) -Body mass index is 51.18 kg/m.  Encounter for surveillance of contraceptive pills -     Nortrel 7/7/7; Take 1 tablet by mouth daily.  Dispense: 84 tablet; Refill: 3  Screen for colon cancer -     Ambulatory referral to Gastroenterology  Prediabetes  Anxiety -     Citalopram Hydrobromide; Take 1 tablet (40 mg total) by mouth daily.  Dispense: 90 tablet; Refill: 3  Elevated BP reading in clinic.  Recheck BP.  Patient encouraged to obtain BP monitor for home use.  Notify clinic for readings consistently greater than 140/90.  Lifestyle modifications including decreasing sodium intake.  Consider sleep study for continued elevation in BP.  Refills provided.  PHQ-9 score 2 this visit previously 3 and GAD-7 score 0.  Continue self-care.  Referral for colonoscopy placed.  Return in about 1 month (around 02/07/2024) for blood pressure.   Deeann Saint, MD

## 2024-01-12 ENCOUNTER — Telehealth: Payer: Self-pay | Admitting: *Deleted

## 2024-01-12 ENCOUNTER — Other Ambulatory Visit: Payer: Self-pay

## 2024-01-12 DIAGNOSIS — Z1211 Encounter for screening for malignant neoplasm of colon: Secondary | ICD-10-CM | POA: Insufficient documentation

## 2024-01-12 NOTE — Telephone Encounter (Signed)
 Pts colon has been rescheduled at [facility] for 03/10/24 at 11:15am with Dr. Marina Goodell. [id number].

## 2024-01-12 NOTE — Telephone Encounter (Signed)
 Yesi,  This pt's BMI is greater than 50; their procedure will need to be performed at the hospital.  Thanks,  Cathlyn Parsons

## 2024-01-14 ENCOUNTER — Ambulatory Visit (AMBULATORY_SURGERY_CENTER)

## 2024-01-14 ENCOUNTER — Other Ambulatory Visit: Payer: Self-pay

## 2024-01-14 VITALS — Ht 69.0 in | Wt 325.0 lb

## 2024-01-14 DIAGNOSIS — Z1211 Encounter for screening for malignant neoplasm of colon: Secondary | ICD-10-CM

## 2024-01-14 MED ORDER — NA SULFATE-K SULFATE-MG SULF 17.5-3.13-1.6 GM/177ML PO SOLN: 1.0000 | Freq: Once | ORAL | 0 refills | Status: AC

## 2024-01-14 NOTE — Progress Notes (Signed)
 Denies allergies to eggs or soy products. Denies complication of anesthesia or sedation. Denies use of weight loss medication. Denies use of O2.   Emmi instructions given for colonoscopy.

## 2024-01-14 NOTE — Telephone Encounter (Signed)
 Left message for the patient to call back and verify that they had received information about the colonoscopy being moved to [facility] on 03/10/24.  Needed to confirm that she had received new prep instructions with new date and times.Left message for patient to call back.

## 2024-01-18 NOTE — Telephone Encounter (Signed)
 Attempt to reach pt to clarify if she has reviewed the updated information concerning new date and location. LM with call back #

## 2024-01-18 NOTE — Telephone Encounter (Signed)
 Unable to reach pt to clarify if they know about the changes in schedule etc. LM and gave call back #

## 2024-01-20 NOTE — Telephone Encounter (Signed)
 Pt states she is aware of the procedure changes and location. No other questions at this time.

## 2024-01-27 ENCOUNTER — Other Ambulatory Visit (HOSPITAL_BASED_OUTPATIENT_CLINIC_OR_DEPARTMENT_OTHER): Payer: Self-pay

## 2024-01-27 ENCOUNTER — Encounter: Payer: Self-pay | Admitting: Physical Therapy

## 2024-01-27 MED ORDER — MONTELUKAST SODIUM 10 MG PO TABS: 10.0000 mg | ORAL_TABLET | Freq: Every day | ORAL | 3 refills | Status: AC

## 2024-01-28 ENCOUNTER — Other Ambulatory Visit: Payer: Self-pay | Admitting: Physician Assistant

## 2024-01-28 DIAGNOSIS — G8929 Other chronic pain: Secondary | ICD-10-CM

## 2024-02-02 ENCOUNTER — Other Ambulatory Visit (HOSPITAL_BASED_OUTPATIENT_CLINIC_OR_DEPARTMENT_OTHER): Payer: Self-pay

## 2024-02-03 ENCOUNTER — Encounter: Admitting: Internal Medicine

## 2024-02-10 ENCOUNTER — Ambulatory Visit: Admitting: Family Medicine

## 2024-02-19 ENCOUNTER — Ambulatory Visit: Admitting: Physical Therapy

## 2024-02-19 ENCOUNTER — Encounter: Payer: Self-pay | Admitting: Physical Therapy

## 2024-02-19 DIAGNOSIS — M6281 Muscle weakness (generalized): Secondary | ICD-10-CM | POA: Diagnosis not present

## 2024-02-19 DIAGNOSIS — M25672 Stiffness of left ankle, not elsewhere classified: Secondary | ICD-10-CM | POA: Diagnosis not present

## 2024-02-19 DIAGNOSIS — M25572 Pain in left ankle and joints of left foot: Secondary | ICD-10-CM

## 2024-02-19 NOTE — Therapy (Signed)
 OUTPATIENT PHYSICAL THERAPY LOWER EXTREMITY EVALUATION   Patient Name: Erika Mullins MRN: [id number] DOB:08/04/1976, 48 y.o., female Today's Date: 02/19/2024  END OF SESSION:  PT End of Session - 02/19/24 0807     Visit Number 1    Number of Visits 6    Date for PT Re-Evaluation 04/01/24    Authorization Type [employer] $25 copay    PT Start Time 0805    PT Stop Time 0842    PT Time Calculation (min) 37 min    Activity Tolerance Patient tolerated treatment well    Behavior During Therapy [facility] for tasks assessed/performed              Past Medical History:  Diagnosis Date   Adult acne    Allergy    Dermatitis due to solar radiation 07/21/2023   Dysfunctional uterine bleeding    Obesity    Past Surgical History:  Procedure Laterality Date   CARPAL TUNNEL RELEASE  09/2010   Right   TONSILLECTOMY AND ADENOIDECTOMY     TYMPANOSTOMY TUBE PLACEMENT     bilateral   Patient Active Problem List   Diagnosis Date Noted   Screen for colon cancer 01/12/2024   Plantar fasciitis 11/28/2022   Reactive thrombocytosis 09/17/2021   Heel pain, chronic, left 09/05/2021   Contusion of right knee 09/20/2020   Prediabetes 08/14/2020   Mixed hyperlipidemia 08/10/2020   Seasonal allergies 08/10/2020   Pain in right shoulder 08/07/2020   Carpal tunnel syndrome, bilateral 08/07/2020   H/O carpal tunnel repair 11/04/2018   Nasal injury 05/31/2018   Routine general medical examination at a health care facility 09/12/2014   Anxiety 09/17/2012   Class 3 severe obesity due to excess calories with body mass index (BMI) of 50.0 to 59.9 in adult 06/30/2011    PCP: Viola Greulich, MD  REFERRING PROVIDER: Persons, Norma Beckers, PA  REFERRING DIAG: [phone] (ICD-10-CM) - Heel pain, chronic, left  THERAPY DIAG:  Stiffness of left ankle, not elsewhere classified - Plan: PT plan of care cert/re-cert  Pain in left ankle and joints of left foot - Plan: PT plan of care cert/re-cert  Muscle  weakness (generalized) - Plan: PT plan of care cert/re-cert  Rationale for Evaluation and Treatment: Rehabilitation  ONSET DATE: several years, exacerbation x 3 months  SUBJECTIVE:   SUBJECTIVE STATEMENT: Pt with continued Lt heel pain that is chronic in nature; but has recently flared up over the past 3 months.  She has been seen at this facility for similar conditions over the past few years.   PERTINENT HISTORY: prior carpal tunnel repair, anxiety  PAIN:  Are you having pain: none Location/description: L achilles Best-worst over past week: 0-7/10  Aggravating factors: morning pain, prolonged WB, standing/walking Easing factors: rest, footwear    PRECAUTIONS: None  WEIGHT BEARING RESTRICTIONS: No  FALLS:  Has patient fallen in last 6 months? No  LIVING ENVIRONMENT: 2 story home 4STE, 14 steps to bed/bath one rail, with dogs  OCCUPATION: works for city, TEFL teacher   PLOF: Independent  PATIENT GOALS: be able to walk more, be able to more consistently go to gym   NEXT MD VISIT: TBD   OBJECTIVE:   DIAGNOSTIC FINDINGS:  Refer to EPIC for imaging history  PATIENT SURVEYS:  Patient-Specific Activity Scoring Scheme  "0" represents "unable to perform." "10" represents "able to perform at prior level. 0 1 2 3 4 5 6 7 8 9  10 (Date and Score)   Activity Eval  1. Exercise 4    2. Standing long periods-work events 4    Score 4    Total score = sum of the activity scores/number of activities Minimum detectable change (90%CI) for average score = 2 points Minimum detectable change (90%CI) for single activity score = 3 points     COGNITION: Overall cognitive status: Within functional limits for tasks assessed     SENSATION: No neuro complaints, appears grossly intact with exam today  EDEMA/OBSERVATION:  No observable edema on eval although she does endorse swelling in foot/ankle at times with increased activity   PALPATION: Concordant  tenderness distal L achilles especially at calcaneal insertion, none throughout plantar fascia, heel, or gastroc soleus  LOWER EXTREMITY ROM:   Active ROM Right eval Left eval  Knee flexion    Knee extension    Ankle dorsiflexion 5 deg  Active: lacking 10 deg Passive: 0  Ankle plantarflexion    Ankle inversion 30 deg  30 deg  Ankle eversion 20 deg  A: 8 deg P: 14 deg   (Blank rows = not tested) (Key: WFL = within functional limits not formally assessed, * = concordant pain, s = stiffness/stretching sensation, NT = not tested)  Comments:    LOWER EXTREMITY MMT   MMT Right eval Left eval  Knee flexion    Knee extension    Ankle dorsiflexion  5  Ankle plantarflexion   4 (no pain this morning)  Ankle inversion  5  Ankle eversion  5   (Blank rows = not tested) (Key: WFL = within functional limits not formally assessed, * = concordant pain, s = stiffness/stretching sensation, NT = not tested)  Comments:     GAIT: Distance walked: within clinic Assistive device utilized: None Level of assistance: Complete Independence Comments: noted increase in pronation, mildly widened BOS and increased forward pelvic rotation BIL with LE advancement   TODAY'S TREATMENT:                                                                                                                              04/16/23 TherEx See HEP - min cues with demonstration PRN for understanding  Manual STM with compression to Lt soleus and gastroc; skilled palpation and monitoring of soft tissue during DN  Trigger Point Dry Needling  Initial Treatment: Pt instructed on Dry Needling rational, procedures, and possible side effects. Pt instructed to expect mild to moderate muscle soreness later in the day and/or into the next day.  Pt instructed in methods to reduce muscle soreness. Pt instructed to continue prescribed HEP. Patient was educated on signs and symptoms of infection and other risk factors and  advised to seek medical attention should they occur.  Patient verbalized understanding of these instructions and education.   Patient Verbal Consent Given: Yes Education Handout Provided: Previously Provided Muscles Treated: Lt soleus Electrical Stimulation Performed: Yes, Parameters: 10 mA freq with intensity to tolerance x 5 min Treatment Response/Outcome: twitch  responses noted   PATIENT EDUCATION:  Education details: Pt education on PT impairments, prognosis, and POC. Informed consent. Rationale for interventions, safe/appropriate HEP performance Person educated: Patient Education method: Explanation, Demonstration, Tactile cues, Verbal cues, and Handouts Education comprehension: verbalized understanding, returned demonstration, verbal cues required, tactile cues required, and needs further education    HOME EXERCISE PROGRAM: Access Code: [email or web address] URL: [email or web address] Date: 02/19/2024 Prepared by: Casimer Clear  Exercises - Toe Yoga - Alternating Great Toe and Lesser Toe Extension  - 2-3 x daily - 7 x weekly - 1 sets - 10 reps - Standing Gastroc Stretch  - 2-3 x daily - 7 x weekly - 1 sets - 1-3 reps - 30sec hold - Soleus Stretch on Wall  - 2-3 x daily - 7 x weekly - 1 sets - 1-3 reps - 30 sec hold  ASSESSMENT:  CLINICAL IMPRESSION: Pt is a very pleasant 48 year old woman who arrives to PT evaluation on this date for chronic heel pain.She demonstrates decreased strength and ROM, active trigger points and continued pain affecting functional mobility.  She will benefit from PT to address deficits listed.    OBJECTIVE IMPAIRMENTS: Abnormal gait, decreased activity tolerance, decreased endurance, decreased mobility, difficulty walking, decreased ROM, decreased strength, and pain.   ACTIVITY LIMITATIONS: carrying, standing, squatting, stairs, and locomotion level  PARTICIPATION LIMITATIONS: meal prep, cleaning, laundry, community activity, and  occupation  PERSONAL FACTORS: Time since onset of injury/illness/exacerbation and 1 comorbidity: anxiety  are also affecting patient's functional outcome.   REHAB POTENTIAL: Good  CLINICAL DECISION MAKING: Stable/uncomplicated  EVALUATION COMPLEXITY: Low   GOALS: Goals reviewed with patient? Yes  SHORT TERM GOALS: Target date: 03/11/2024   Independent with initial HEP Goal status: INITIAL    LONG TERM GOALS: Target date: 04/01/2024  Independent with final HEP Goal status: INITIAL  2.  PSFS score improved by 2 points for improved function Goal status: INITIAL  3.  Lt plantarflexion strength improved to 5/5 for improved function Goal status: INIITAL  4.  Report pain < 4/10 with standing and walking for work events for improved function Goal status: INITIAL  5.  Lt ankle dorsiflexion AROM improved to neutral for improved mobility Goal status: INITIAL       PLAN:  PT FREQUENCY: 1-2x/week  PT DURATION: 8 weeks  PLANNED INTERVENTIONS: Therapeutic exercises, Therapeutic activity, Neuromuscular re-education, Balance training, Gait training, Patient/Family education, Self Care, Joint mobilization, Joint manipulation, Stair training, Aquatic Therapy, Dry Needling, Electrical stimulation, Cryotherapy, Moist heat, Taping, Manual therapy, and Re-evaluation  PLAN FOR NEXT SESSION:  Review/update HEP PRN. Work on [employer] exercises as appropriate with emphasis on ankle DF ROM, calf strength, and foot intrinsic/extrinsic coordination in open and closed chain. Symptom modification strategies as indicated/appropriate.   Assess response to DN   Marley Simmers, PT, DPT 02/19/24 9:27 AM

## 2024-02-22 ENCOUNTER — Ambulatory Visit: Admitting: Family Medicine

## 2024-02-22 ENCOUNTER — Encounter: Payer: Self-pay | Admitting: Internal Medicine

## 2024-02-26 ENCOUNTER — Ambulatory Visit: Admitting: Family Medicine

## 2024-03-03 ENCOUNTER — Encounter (HOSPITAL_COMMUNITY): Payer: Self-pay | Admitting: Internal Medicine

## 2024-03-03 ENCOUNTER — Telehealth: Payer: Self-pay | Admitting: Gastroenterology

## 2024-03-03 NOTE — Telephone Encounter (Addendum)
 Procedure:Colonoscopy Procedure date: 03/10/24 Procedure location: [facility] Arrival Time: 9:45 am Spoke with the patient Y/N:   No, I left a detailed message 03/03/24 @ 10:42 am for the patient to return call  No, I left a detailed message 03/04/24 @ 10:00 am for the patient to return call   Any prep concerns? No  Has the patient obtained the prep from the pharmacy ? No, pt states she is going to pick up medication today, if medication is not at the pharmacy patient will let us  know so will can recall it in Do you have a care partner and transportation: Yes Any additional concerns? No

## 2024-03-03 NOTE — Progress Notes (Signed)
 Attempted to obtain medical history for pre op call via telephone, unable to reach at this time. HIPAA compliant voicemail message left requesting return call to pre surgical testing department.

## 2024-03-10 ENCOUNTER — Other Ambulatory Visit: Payer: Self-pay

## 2024-03-10 ENCOUNTER — Encounter (HOSPITAL_COMMUNITY): Payer: Self-pay | Admitting: Internal Medicine

## 2024-03-10 ENCOUNTER — Ambulatory Visit (HOSPITAL_COMMUNITY): Admitting: Anesthesiology

## 2024-03-10 ENCOUNTER — Encounter (HOSPITAL_COMMUNITY)
Admission: RE | Disposition: A | Discharge status: Home / Self Care | Payer: Self-pay | Source: Home / Self Care | Attending: Internal Medicine

## 2024-03-10 ENCOUNTER — Ambulatory Visit (HOSPITAL_COMMUNITY)
Admission: RE | Admit: 2024-03-10 | Discharge: 2024-03-10 | Disposition: A | Discharge status: Home / Self Care | Attending: Internal Medicine | Admitting: Internal Medicine

## 2024-03-10 DIAGNOSIS — D120 Benign neoplasm of cecum: Secondary | ICD-10-CM | POA: Diagnosis not present

## 2024-03-10 DIAGNOSIS — Z6841 Body Mass Index (BMI) 40.0 and over, adult: Secondary | ICD-10-CM

## 2024-03-10 DIAGNOSIS — Z1211 Encounter for screening for malignant neoplasm of colon: Secondary | ICD-10-CM | POA: Insufficient documentation

## 2024-03-10 DIAGNOSIS — E66813 Obesity, class 3: Secondary | ICD-10-CM

## 2024-03-10 DIAGNOSIS — D122 Benign neoplasm of ascending colon: Secondary | ICD-10-CM | POA: Diagnosis not present

## 2024-03-10 DIAGNOSIS — K5730 Diverticulosis of large intestine without perforation or abscess without bleeding: Secondary | ICD-10-CM | POA: Diagnosis not present

## 2024-03-10 DIAGNOSIS — K648 Other hemorrhoids: Secondary | ICD-10-CM | POA: Insufficient documentation

## 2024-03-10 HISTORY — PX: COLONOSCOPY: SHX5424

## 2024-03-10 SURGERY — COLONOSCOPY
Anesthesia: Monitor Anesthesia Care

## 2024-03-10 MED ORDER — LIDOCAINE HCL 1 % IJ SOLN
INTRAMUSCULAR | Status: DC | PRN
  Administered 2024-03-10: 50 mg via INTRADERMAL

## 2024-03-10 MED ORDER — PROPOFOL 500 MG/50ML IV EMUL
INTRAVENOUS | Status: DC | PRN
  Administered 2024-03-10: 130 ug/kg/min via INTRAVENOUS

## 2024-03-10 MED ORDER — PROPOFOL 10 MG/ML IV BOLUS
INTRAVENOUS | Status: DC | PRN
  Administered 2024-03-10 (×2): 10 mg via INTRAVENOUS

## 2024-03-10 MED ORDER — SODIUM CHLORIDE 0.9 % IV SOLN
INTRAVENOUS | Status: DC
Start: 2024-03-10 — End: 2024-03-10

## 2024-03-10 MED ORDER — PROPOFOL 1000 MG/100ML IV EMUL
INTRAVENOUS | Status: AC
  Filled 2024-03-10: qty 100

## 2024-03-10 NOTE — Op Note (Signed)
 [facility] Patient Name: Erika Mullins Procedure Date: 03/10/2024 MRN: [id number] Attending MD: Murel Arlington. Elvin Hammer , MD, [id number] Date of Birth: 1976-03-01 CSN: [id number] Age: 48 Admit Type: Outpatient Procedure:                Colonoscopy with cold snare polypectomy x 2 Indications:              Screening for colorectal malignant neoplasm Providers:                Murel Arlington. Elvin Hammer, MD, Maxie Spaniel, RN, Nicki Barnacle, Technician Referring MD:             Viola Greulich, MD Medicines:                Monitored Anesthesia Care Complications:            No immediate complications. Estimated blood loss:                            None. Estimated Blood Loss:     Estimated blood loss: none. Procedure:                Pre-Anesthesia Assessment:                           - Prior to the procedure, a History and Physical                            was performed, and patient medications and                            allergies were reviewed. The patient's tolerance of                            previous anesthesia was also reviewed. The risks                            and benefits of the procedure and the sedation                            options and risks were discussed with the patient.                            All questions were answered, and informed consent                            was obtained. Prior Anticoagulants: The patient has                            taken no anticoagulant or antiplatelet agents. ASA                            Grade Assessment: III - A patient with severe  systemic disease. After reviewing the risks and                            benefits, the patient was deemed in satisfactory                            condition to undergo the procedure.                           After obtaining informed consent, the colonoscope                            was passed under direct vision. Throughout the                             procedure, the patient's blood pressure, pulse, and                            oxygen saturations were monitored continuously. The                            CF-HQ190L ([id number] Olympus colonoscope was                            introduced through the anus and advanced to the the                            cecum, identified by appendiceal orifice and                            ileocecal valve. The ileocecal valve, appendiceal                            orifice, and rectum were photographed. The quality                            of the bowel preparation was excellent. The                            colonoscopy was performed without difficulty. The                            patient tolerated the procedure well. The bowel                            preparation used was SUPREP via split dose                            instruction. Scope In: 12:05:43 PM Scope Out: 12:21:48 PM Scope Withdrawal Time: 0 hours 14 minutes 10 seconds  Total Procedure Duration: 0 hours 16 minutes 5 seconds  Findings:      Two sessile polyps were found in the ascending colon and cecum. The       polyps were 3 to 8 mm in size. These polyps  were removed with a cold       snare. Resection and retrieval were complete.      Many diverticula were found in the left colon.      Internal hemorrhoids were found during retroflexion.      The exam was otherwise without abnormality on direct and retroflexion       views. Impression:               - Two 3 to 8 mm polyps in the ascending colon and                            in the cecum, removed with a cold snare. Resected                            and retrieved.                           - Diverticulosis in the left colon.                           - Internal hemorrhoids.                           - The examination was otherwise normal on direct                            and retroflexion views. Moderate Sedation:      none Recommendation:            - Repeat colonoscopy in 5 years for surveillance.                           - Patient has a contact number available for                            emergencies. The signs and symptoms of potential                            delayed complications were discussed with the                            patient. Return to normal activities tomorrow.                            Written discharge instructions were provided to the                            patient.                           - Resume previous diet.                           - Continue present medications.                           - Await pathology results. Procedure Code(s):        ---  Professional ---                           [phone], Colonoscopy, flexible; with removal of                            tumor(s), polyp(s), or other lesion(s) by snare                            technique Diagnosis Code(s):        --- Professional ---                           Z12.11, Encounter for screening for malignant                            neoplasm of colon                           D12.2, Benign neoplasm of ascending colon                           D12.0, Benign neoplasm of cecum                           K64.8, Other hemorrhoids                           K57.30, Diverticulosis of large intestine without                            perforation or abscess without bleeding CPT copyright 2022 American Medical Association. All rights reserved. The codes documented in this report are preliminary and upon coder review may  be revised to meet current compliance requirements. Murel Arlington. Elvin Hammer, MD 03/10/2024 12:31:43 PM This report has been signed electronically. Number of Addenda: 0

## 2024-03-10 NOTE — Discharge Instructions (Signed)

## 2024-03-10 NOTE — Transfer of Care (Signed)
 Immediate Anesthesia Transfer of Care Note  Patient: Erika Mullins  Procedure(s) Performed: COLONOSCOPY  Patient Location: PACU and Endoscopy Unit  Anesthesia Type:MAC  Level of Consciousness: awake, alert , oriented, and patient cooperative  Airway & Oxygen Therapy: Patient Spontanous Breathing and Patient connected to face mask oxygen  Post-op Assessment: Report given to RN and Post -op Vital signs reviewed and stable  Post vital signs: Reviewed and stable  Last Vitals:  Vitals Value Taken Time  BP 161/87 03/10/24 1231  Temp 36.9 C 03/10/24 1231  Pulse 97 03/10/24 1231  Resp 25 03/10/24 1231  SpO2 96 % 03/10/24 1231  Vitals shown include unfiled device data.  Last Pain:  Vitals:   03/10/24 1231  TempSrc: Temporal  PainSc: 0-No pain         Complications: No notable events documented.

## 2024-03-10 NOTE — H&P (Signed)
 HISTORY OF PRESENT ILLNESS:  Erika Mullins is a 48 y.o. female presents for routine screening colonoscopy.  Her examination is being performed at the hospital due to a BMI of greater than 50.  She was evaluated in the preprocedure admissions area today.  No GI complaints.  No family history.  Tolerated prep well  REVIEW OF SYSTEMS:  All non-GI ROS negative except for  Past Medical History:  Diagnosis Date   Adult acne    Allergy    Dermatitis due to solar radiation 07/21/2023   Dysfunctional uterine bleeding    Obesity     Past Surgical History:  Procedure Laterality Date   CARPAL TUNNEL RELEASE  09/2010   Right   TONSILLECTOMY AND ADENOIDECTOMY     TYMPANOSTOMY TUBE PLACEMENT     bilateral    Social History Erika Mullins  reports that she has never smoked. She has never used smokeless tobacco. She reports current alcohol use. She reports that she does not use drugs.  family history includes Cancer in her maternal grandfather; Heart attack in her maternal grandfather; Heart disease in her father; Hyperlipidemia in her father, maternal grandfather, and another family member; Hypertension in an other family member; Obesity in her father and another family member; Stroke in her maternal grandmother.  Allergies  Allergen Reactions   Doxycycline Hives and Other (See Comments)   Nickel Hives and Other (See Comments)       PHYSICAL EXAMINATION: Vital signs: BP (!) 146/94   Pulse 86   Temp (!) 97.5 F (36.4 C) (Temporal)   Resp 17   Ht 5\' 9"  (1.753 m)   Wt (!) 147.4 kg   LMP 03/01/2024 (Approximate)   SpO2 99%   BMI 47.99 kg/m  General: Well-developed, well-nourished, no acute distress HEENT: Sclerae are anicteric, conjunctiva pink. Oral mucosa intact Lungs: Clear Heart: Regular Abdomen: soft, nontender, nondistended, no obvious ascites, no peritoneal signs, normal bowel sounds. No organomegaly. Extremities: No edema Psychiatric: alert and oriented x3.  Cooperative     ASSESSMENT:  Colon cancer screening Morbid obesity   PLAN:  Screening colonoscopy

## 2024-03-10 NOTE — Anesthesia Preprocedure Evaluation (Signed)
 Anesthesia Evaluation  Patient identified by MRN, date of birth, ID band Patient awake    Reviewed: Allergy & Precautions, NPO status , Patient's Chart, lab work & pertinent test results  Airway Mallampati: IV  TM Distance: >3 FB Neck ROM: Full    Dental   Pulmonary neg pulmonary ROS   Pulmonary exam normal        Cardiovascular negative cardio ROS Normal cardiovascular exam     Neuro/Psych negative neurological ROS     GI/Hepatic negative GI ROS, Neg liver ROS,,,  Endo/Other    Class 3 obesity  Renal/GU negative Renal ROS     Musculoskeletal   Abdominal   Peds  Hematology negative hematology ROS (+)   Anesthesia Other Findings   Reproductive/Obstetrics                             Anesthesia Physical Anesthesia Plan  ASA: 3  Anesthesia Plan: MAC   Post-op Pain Management:    Induction:   PONV Risk Score and Plan: 2 and Propofol infusion  Airway Management Planned: Natural Airway and Simple Face Mask  Additional Equipment:   Intra-op Plan:   Post-operative Plan:   Informed Consent: I have reviewed the patients History and Physical, chart, labs and discussed the procedure including the risks, benefits and alternatives for the proposed anesthesia with the patient or authorized representative who has indicated his/her understanding and acceptance.       Plan Discussed with:   Anesthesia Plan Comments:        Anesthesia Quick Evaluation

## 2024-03-11 ENCOUNTER — Ambulatory Visit: Payer: Self-pay | Admitting: Internal Medicine

## 2024-03-11 LAB — SURGICAL PATHOLOGY

## 2024-03-11 NOTE — Anesthesia Postprocedure Evaluation (Signed)
 Anesthesia Post Note  Patient: Erika Mullins  Procedure(s) Performed: COLONOSCOPY     Patient location during evaluation: PACU Anesthesia Type: MAC Level of consciousness: awake and alert Pain management: pain level controlled Vital Signs Assessment: post-procedure vital signs reviewed and stable Respiratory status: spontaneous breathing, nonlabored ventilation, respiratory function stable and patient connected to nasal cannula oxygen Cardiovascular status: stable and blood pressure returned to baseline Postop Assessment: no apparent nausea or vomiting Anesthetic complications: no   No notable events documented.  Last Vitals:  Vitals:   03/10/24 1240 03/10/24 1252  BP: (!) 145/94 131/88  Pulse: 82 76  Resp: 14 20  Temp:    SpO2: 98% 99%    Last Pain:  Vitals:   03/10/24 1252  TempSrc:   PainSc: 0-No pain                 Melvenia Stabs

## 2024-03-14 ENCOUNTER — Encounter (HOSPITAL_COMMUNITY): Payer: Self-pay | Admitting: Internal Medicine

## 2024-03-29 ENCOUNTER — Encounter: Payer: Self-pay | Admitting: Physical Therapy

## 2024-03-29 ENCOUNTER — Other Ambulatory Visit (HOSPITAL_BASED_OUTPATIENT_CLINIC_OR_DEPARTMENT_OTHER): Payer: Self-pay

## 2024-03-30 ENCOUNTER — Other Ambulatory Visit (HOSPITAL_BASED_OUTPATIENT_CLINIC_OR_DEPARTMENT_OTHER): Payer: Self-pay

## 2024-04-06 ENCOUNTER — Encounter: Payer: Self-pay | Admitting: Physical Therapy

## 2024-04-06 ENCOUNTER — Ambulatory Visit: Admitting: Physical Therapy

## 2024-04-06 DIAGNOSIS — R2681 Unsteadiness on feet: Secondary | ICD-10-CM | POA: Diagnosis not present

## 2024-04-06 DIAGNOSIS — M6281 Muscle weakness (generalized): Secondary | ICD-10-CM

## 2024-04-06 DIAGNOSIS — M25672 Stiffness of left ankle, not elsewhere classified: Secondary | ICD-10-CM | POA: Diagnosis not present

## 2024-04-06 DIAGNOSIS — M25572 Pain in left ankle and joints of left foot: Secondary | ICD-10-CM | POA: Diagnosis not present

## 2024-04-06 NOTE — Therapy (Signed)
 OUTPATIENT PHYSICAL THERAPY TREATMENT RECERTIFICATION   Patient Name: Erika Mullins MRN: [id number] DOB:1976-08-08, 48 y.o., female Today's Date: 04/06/2024  END OF SESSION:  PT End of Session - 04/06/24 1150     Visit Number 2    Number of Visits 7    Date for PT Re-Evaluation 05/18/24    Authorization Type [employer] $25 copay    PT Start Time 1148    PT Stop Time 1225    PT Time Calculation (min) 37 min    Activity Tolerance Patient tolerated treatment well    Behavior During Therapy [facility] for tasks assessed/performed            Past Medical History:  Diagnosis Date   Adult acne    Allergy    Dermatitis due to solar radiation 07/21/2023   Dysfunctional uterine bleeding    Obesity    Past Surgical History:  Procedure Laterality Date   CARPAL TUNNEL RELEASE  09/2010   Right   COLONOSCOPY N/A 03/10/2024   Procedure: COLONOSCOPY;  Surgeon: Tobin Forts, MD;  Location: [facility];  Service: Gastroenterology;  Laterality: N/A;   TONSILLECTOMY AND ADENOIDECTOMY     TYMPANOSTOMY TUBE PLACEMENT     bilateral   Patient Active Problem List   Diagnosis Date Noted   Benign neoplasm of cecum 03/10/2024   Benign neoplasm of ascending colon 03/10/2024   Screen for colon cancer 01/12/2024   Plantar fasciitis 11/28/2022   Reactive thrombocytosis 09/17/2021   Heel pain, chronic, left 09/05/2021   Contusion of right knee 09/20/2020   Prediabetes 08/14/2020   Mixed hyperlipidemia 08/10/2020   Seasonal allergies 08/10/2020   Pain in right shoulder 08/07/2020   Carpal tunnel syndrome, bilateral 08/07/2020   H/O carpal tunnel repair 11/04/2018   Nasal injury 05/31/2018   Routine general medical examination at a health care facility 09/12/2014   Anxiety 09/17/2012   Class 3 severe obesity due to excess calories with body mass index (BMI) of 50.0 to 59.9 in adult 06/30/2011    PCP: Viola Greulich, MD  REFERRING PROVIDER: Persons, Norma Beckers, PA  REFERRING DIAG:  [phone] (ICD-10-CM) - Heel pain, chronic, left  THERAPY DIAG:  Stiffness of left ankle, not elsewhere classified - Plan: PT plan of care cert/re-cert  Pain in left ankle and joints of left foot - Plan: PT plan of care cert/re-cert  Muscle weakness (generalized) - Plan: PT plan of care cert/re-cert  Unsteadiness on feet - Plan: PT plan of care cert/re-cert  Rationale for Evaluation and Treatment: Rehabilitation  ONSET DATE: several years, exacerbation x 3 months  SUBJECTIVE:   SUBJECTIVE STATEMENT: Rolled her ankle last week, so having some anterior ankle pain, heel is feeling pretty good though.   PERTINENT HISTORY: prior carpal tunnel repair, anxiety  PAIN:  Are you having pain: none Location/description: L achilles Best-worst over past week: 0-7/10  Aggravating factors: morning pain, prolonged WB, standing/walking Easing factors: rest, footwear    PRECAUTIONS: None  WEIGHT BEARING RESTRICTIONS: No  FALLS:  Has patient fallen in last 6 months? No  LIVING ENVIRONMENT: 2 story home 4STE, 14 steps to bed/bath one rail, with dogs  OCCUPATION: works for city, TEFL teacher   PLOF: Independent  PATIENT GOALS: be able to walk more, be able to more consistently go to gym   NEXT MD VISIT: TBD   OBJECTIVE:   DIAGNOSTIC FINDINGS:  Refer to EPIC for imaging history  PATIENT SURVEYS:  Patient-Specific Activity Scoring Scheme  0 represents "unable  to perform." 10 represents "able to perform at prior level. 0 1 2 3 4 5 6 7 8 9  10 (Date and Score)   Activity Eval     1. Exercise 4    2. Standing long periods-work events 4    Score 4    Total score = sum of the activity scores/number of activities Minimum detectable change (90%CI) for average score = 2 points Minimum detectable change (90%CI) for single activity score = 3 points     COGNITION: Overall cognitive status: Within functional limits for tasks  assessed     SENSATION: No neuro complaints, appears grossly intact with exam today  EDEMA/OBSERVATION:  No observable edema on eval although she does endorse swelling in foot/ankle at times with increased activity   PALPATION: Concordant tenderness distal L achilles especially at calcaneal insertion, none throughout plantar fascia, heel, or gastroc soleus  LOWER EXTREMITY ROM:   Active ROM Right eval Left eval  Knee flexion    Knee extension    Ankle dorsiflexion 5 deg  Active: lacking 10 deg Passive: 0  Ankle plantarflexion    Ankle inversion 30 deg  30 deg  Ankle eversion 20 deg  A: 8 deg P: 14 deg   (Blank rows = not tested) (Key: WFL = within functional limits not formally assessed, * = concordant pain, s = stiffness/stretching sensation, NT = not tested)  Comments:    LOWER EXTREMITY MMT   MMT Right eval Left eval  Knee flexion    Knee extension    Ankle dorsiflexion  5  Ankle plantarflexion   4 (no pain this morning)  Ankle inversion  5  Ankle eversion  5   (Blank rows = not tested) (Key: WFL = within functional limits not formally assessed, * = concordant pain, s = stiffness/stretching sensation, NT = not tested)  Comments:     GAIT: Distance walked: within clinic Assistive device utilized: None Level of assistance: Complete Independence Comments: noted increase in pronation, mildly widened BOS and increased forward pelvic rotation BIL with LE advancement   TODAY'S TREATMENT:                                                                                                                              04/06/24 Manual STM with compression to Lt soleus and gastroc; skilled palpation and monitoring of soft tissue during DN  Trigger Point Dry Needling  Initial Treatment: Pt instructed on Dry Needling rational, procedures, and possible side effects. Pt instructed to expect mild to moderate muscle soreness later in the day and/or into the next day.  Pt  instructed in methods to reduce muscle soreness. Pt instructed to continue prescribed HEP. Patient was educated on signs and symptoms of infection and other risk factors and advised to seek medical attention should they occur.  Patient verbalized understanding of these instructions and education.   Patient Verbal Consent Given: Yes Education Handout Provided: Previously Provided Muscles Treated:  Lt soleus Electrical Stimulation Performed: Yes, Parameters: 10 mA freq with intensity to tolerance x 5 min Treatment Response/Outcome: twitch responses noted  TherEx Lt ankle DF and eversion with L4 band x 10 reps each with mod cues for technique MWM to Lt ankle DF with L5 band with mod cues for technique  03/10/24 TherEx See HEP - min cues with demonstration PRN for understanding  Manual STM with compression to Lt soleus and gastroc; skilled palpation and monitoring of soft tissue during DN  Trigger Point Dry Needling  Initial Treatment: Pt instructed on Dry Needling rational, procedures, and possible side effects. Pt instructed to expect mild to moderate muscle soreness later in the day and/or into the next day.  Pt instructed in methods to reduce muscle soreness. Pt instructed to continue prescribed HEP. Patient was educated on signs and symptoms of infection and other risk factors and advised to seek medical attention should they occur.  Patient verbalized understanding of these instructions and education.   Patient Verbal Consent Given: Yes Education Handout Provided: Previously Provided Muscles Treated: Lt soleus Electrical Stimulation Performed: Yes, Parameters: 10 mA freq with intensity to tolerance x 5 min Treatment Response/Outcome: twitch responses noted   PATIENT EDUCATION:  Education details: Pt education on PT impairments, prognosis, and POC. Informed consent. Rationale for interventions, safe/appropriate HEP performance Person educated: Patient Education method:  Explanation, Demonstration, Tactile cues, Verbal cues, and Handouts Education comprehension: verbalized understanding, returned demonstration, verbal cues required, tactile cues required, and needs further education    HOME EXERCISE PROGRAM: Access Code: [email or web address] URL: [email or web address] Date: 04/06/2024 Prepared by: Casimer Clear  Exercises - Toe Yoga - Alternating Great Toe and Lesser Toe Extension  - 2-3 x daily - 7 x weekly - 1 sets - 10 reps - Standing Gastroc Stretch  - 2-3 x daily - 7 x weekly - 1 sets - 1-3 reps - 30sec hold - Soleus Stretch on Wall  - 2-3 x daily - 7 x weekly - 1 sets - 1-3 reps - 30 sec hold - Seated Ankle Eversion with Resistance  - 2 x daily - 7 x weekly - 2 sets - 20 reps - Seated Ankle Dorsiflexion with Resistance (Mirrored)  - 2 x daily - 7 x weekly - 2 sets - 20 reps - Standing Ankle Dorsiflexion Self-Mobilization on Chair With Band  - 1 x daily - 7 x weekly - 1 sets - 5 reps - 15-20 sec hold  ASSESSMENT:  CLINICAL IMPRESSION: Pt with improvement in symptoms of heel but does report inversion ankle sprain which is causing some discomfort.  Added ankle strengthening exercises today with mobilization exercises to help with symptoms.  Will continue to benefit from PT to maximize function.  OBJECTIVE IMPAIRMENTS: Abnormal gait, decreased activity tolerance, decreased endurance, decreased mobility, difficulty walking, decreased ROM, decreased strength, and pain.   ACTIVITY LIMITATIONS: carrying, standing, squatting, stairs, and locomotion level  PARTICIPATION LIMITATIONS: meal prep, cleaning, laundry, community activity, and occupation  PERSONAL FACTORS: Time since onset of injury/illness/exacerbation and 1 comorbidity: anxiety are also affecting patient's functional outcome.   REHAB POTENTIAL: Good  CLINICAL DECISION MAKING: Stable/uncomplicated  EVALUATION COMPLEXITY: Low   GOALS: Goals reviewed with patient? Yes  SHORT TERM  GOALS: Target date: 03/11/2024   Independent with initial HEP Goal status: MET 04/06/24    LONG TERM GOALS: Target date: 05/18/2024  Independent with final HEP Goal status: ONGOING 04/06/24  2.  PSFS score improved by 2 points for improved function Goal status: ONGOING 04/06/24  3.  Lt plantarflexion strength improved to 5/5 for improved function Goal status: ONGOING 04/06/24  4.  Report pain < 4/10 with standing and walking for work events for improved function Goal status: ONGOING 04/06/24  5.  Lt ankle dorsiflexion AROM improved to neutral for improved mobility Goal status: ONGOING 04/06/24       PLAN:  PT FREQUENCY: 1-2x/week  PT DURATION: 8 weeks  PLANNED INTERVENTIONS: Therapeutic exercises, Therapeutic activity, Neuromuscular re-education, Balance training, Gait training, Patient/Family education, Self Care, Joint mobilization, Joint manipulation, Stair training, Aquatic Therapy, Dry Needling, Electrical stimulation, Cryotherapy, Moist heat, Taping, Manual therapy, and Re-evaluation  PLAN FOR NEXT SESSION:  Review/update HEP PRN. Work on [employer] exercises as appropriate with emphasis on ankle DF ROM, calf strength, and foot intrinsic/extrinsic coordination in open and closed chain. Symptom modification strategies as indicated/appropriate.   Assess response to DN   Marley Simmers, PT, DPT 04/06/24 12:33 PM

## 2024-04-13 ENCOUNTER — Encounter: Admitting: Physical Therapy

## 2024-04-20 ENCOUNTER — Ambulatory Visit (INDEPENDENT_AMBULATORY_CARE_PROVIDER_SITE_OTHER): Admitting: Physical Therapy

## 2024-04-20 ENCOUNTER — Encounter: Payer: Self-pay | Admitting: Physical Therapy

## 2024-04-20 DIAGNOSIS — M6281 Muscle weakness (generalized): Secondary | ICD-10-CM

## 2024-04-20 DIAGNOSIS — M25572 Pain in left ankle and joints of left foot: Secondary | ICD-10-CM | POA: Diagnosis not present

## 2024-04-20 DIAGNOSIS — M25672 Stiffness of left ankle, not elsewhere classified: Secondary | ICD-10-CM | POA: Diagnosis not present

## 2024-04-20 DIAGNOSIS — R2681 Unsteadiness on feet: Secondary | ICD-10-CM | POA: Diagnosis not present

## 2024-04-20 NOTE — Therapy (Signed)
 OUTPATIENT PHYSICAL THERAPY TREATMENT    Patient Name: Erika Mullins MRN: [id number] DOB:08/18/1976, 48 y.o., female Today's Date: 04/20/2024  END OF SESSION:  PT End of Session - 04/20/24 0933     Visit Number 3    Number of Visits 7    Date for PT Re-Evaluation 05/18/24    Authorization Type [employer] $25 copay    PT Start Time 0932    PT Stop Time 1002    PT Time Calculation (min) 30 min    Activity Tolerance Patient tolerated treatment well    Behavior During Therapy [facility] for tasks assessed/performed             Past Medical History:  Diagnosis Date   Adult acne    Allergy    Dermatitis due to solar radiation 07/21/2023   Dysfunctional uterine bleeding    Obesity    Past Surgical History:  Procedure Laterality Date   CARPAL TUNNEL RELEASE  09/2010   Right   COLONOSCOPY N/A 03/10/2024   Procedure: COLONOSCOPY;  Surgeon: Abran Norleen SAILOR, MD;  Location: [facility];  Service: Gastroenterology;  Laterality: N/A;   TONSILLECTOMY AND ADENOIDECTOMY     TYMPANOSTOMY TUBE PLACEMENT     bilateral   Patient Active Problem List   Diagnosis Date Noted   Benign neoplasm of cecum 03/10/2024   Benign neoplasm of ascending colon 03/10/2024   Screen for colon cancer 01/12/2024   Plantar fasciitis 11/28/2022   Reactive thrombocytosis 09/17/2021   Heel pain, chronic, left 09/05/2021   Contusion of right knee 09/20/2020   Prediabetes 08/14/2020   Mixed hyperlipidemia 08/10/2020   Seasonal allergies 08/10/2020   Pain in right shoulder 08/07/2020   Carpal tunnel syndrome, bilateral 08/07/2020   H/O carpal tunnel repair 11/04/2018   Nasal injury 05/31/2018   Routine general medical examination at a health care facility 09/12/2014   Anxiety 09/17/2012   Class 3 severe obesity due to excess calories with body mass index (BMI) of 50.0 to 59.9 in adult 06/30/2011    PCP: Mercer Clotilda SAUNDERS, MD  REFERRING PROVIDER: Persons, Ronal Dragon, PA  REFERRING DIAG: [phone]  (ICD-10-CM) - Heel pain, chronic, left  THERAPY DIAG:  Stiffness of left ankle, not elsewhere classified  Pain in left ankle and joints of left foot  Muscle weakness (generalized)  Unsteadiness on feet  Rationale for Evaluation and Treatment: Rehabilitation  ONSET DATE: several years, exacerbation x 3 months  SUBJECTIVE:   SUBJECTIVE STATEMENT: Ankle is doing pretty well; still a little sore and rest of the ankle is doing well.    PERTINENT HISTORY: prior carpal tunnel repair, anxiety  PAIN:  Are you having pain: none Location/description: L achilles Best-worst over past week: 0-7/10  Aggravating factors: morning pain, prolonged WB, standing/walking Easing factors: rest, footwear    PRECAUTIONS: None  WEIGHT BEARING RESTRICTIONS: No  FALLS:  Has patient fallen in last 6 months? No  LIVING ENVIRONMENT: 2 story home 4STE, 14 steps to bed/bath one rail, with dogs  OCCUPATION: works for city, TEFL teacher   PLOF: Independent  PATIENT GOALS: be able to walk more, be able to more consistently go to gym   NEXT MD VISIT: TBD   OBJECTIVE:   DIAGNOSTIC FINDINGS:  Refer to EPIC for imaging history  PATIENT SURVEYS:  Patient-Specific Activity Scoring Scheme  0 represents "unable to perform." 10 represents "able to perform at prior level. 0 1 2 3 4 5 6 7 8 9  10 (Date and Score)  Activity Eval     1. Exercise 4    2. Standing long periods-work events 4    Score 4    Total score = sum of the activity scores/number of activities Minimum detectable change (90%CI) for average score = 2 points Minimum detectable change (90%CI) for single activity score = 3 points     COGNITION: Overall cognitive status: Within functional limits for tasks assessed     SENSATION: No neuro complaints, appears grossly intact with exam today  EDEMA/OBSERVATION:  No observable edema on eval although she does endorse swelling in foot/ankle at times with  increased activity   PALPATION: Concordant tenderness distal L achilles especially at calcaneal insertion, none throughout plantar fascia, heel, or gastroc soleus  LOWER EXTREMITY ROM:   Active ROM Right eval Left eval  Knee flexion    Knee extension    Ankle dorsiflexion 5 deg  Active: lacking 10 deg Passive: 0  Ankle plantarflexion    Ankle inversion 30 deg  30 deg  Ankle eversion 20 deg  A: 8 deg P: 14 deg   (Blank rows = not tested) (Key: WFL = within functional limits not formally assessed, * = concordant pain, s = stiffness/stretching sensation, NT = not tested)  Comments:    LOWER EXTREMITY MMT   MMT Right eval Left eval  Knee flexion    Knee extension    Ankle dorsiflexion  5  Ankle plantarflexion   4 (no pain this morning)  Ankle inversion  5  Ankle eversion  5   (Blank rows = not tested) (Key: WFL = within functional limits not formally assessed, * = concordant pain, s = stiffness/stretching sensation, NT = not tested)  Comments:     GAIT: Distance walked: within clinic Assistive device utilized: None Level of assistance: Complete Independence Comments: noted increase in pronation, mildly widened BOS and increased forward pelvic rotation BIL with LE advancement   TODAY'S TREATMENT:                                                                                                                              04/20/24 Manual STM with compression to Lt soleus and gastroc; skilled palpation and monitoring of soft tissue during DN  Trigger Point Dry Needling  Subsequent Treatment: Instructions provided previously at initial dry needling treatment.  Patient Verbal Consent Given: Yes Education Handout Provided: Previously Provided Muscles Treated: Lt soleus, gastroc Electrical Stimulation Performed: Yes, Parameters: 10 mA freq with intensity to tolerance x 5 min Treatment Response/Outcome: twitch responses noted   04/06/24 Manual STM with compression to Lt  soleus and gastroc; skilled palpation and monitoring of soft tissue during DN  Trigger Point Dry Needling  Initial Treatment: Pt instructed on Dry Needling rational, procedures, and possible side effects. Pt instructed to expect mild to moderate muscle soreness later in the day and/or into the next day.  Pt instructed in methods to reduce muscle soreness. Pt instructed to continue  prescribed HEP. Patient was educated on signs and symptoms of infection and other risk factors and advised to seek medical attention should they occur.  Patient verbalized understanding of these instructions and education.   Patient Verbal Consent Given: Yes Education Handout Provided: Previously Provided Muscles Treated: Lt soleus Electrical Stimulation Performed: Yes, Parameters: 10 mA freq with intensity to tolerance x 5 min Treatment Response/Outcome: twitch responses noted  TherEx Lt ankle DF and eversion with L4 band x 10 reps each with mod cues for technique MWM to Lt ankle DF with L5 band with mod cues for technique  03/10/24 TherEx See HEP - min cues with demonstration PRN for understanding  Manual STM with compression to Lt soleus and gastroc; skilled palpation and monitoring of soft tissue during DN  Trigger Point Dry Needling  Initial Treatment: Pt instructed on Dry Needling rational, procedures, and possible side effects. Pt instructed to expect mild to moderate muscle soreness later in the day and/or into the next day.  Pt instructed in methods to reduce muscle soreness. Pt instructed to continue prescribed HEP. Patient was educated on signs and symptoms of infection and other risk factors and advised to seek medical attention should they occur.  Patient verbalized understanding of these instructions and education.   Patient Verbal Consent Given: Yes Education Handout Provided: Previously Provided Muscles Treated: Lt soleus Electrical Stimulation Performed: Yes, Parameters: 10 mA freq  with intensity to tolerance x 5 min Treatment Response/Outcome: twitch responses noted   PATIENT EDUCATION:  Education details: Pt education on PT impairments, prognosis, and POC. Informed consent. Rationale for interventions, safe/appropriate HEP performance Person educated: Patient Education method: Explanation, Demonstration, Tactile cues, Verbal cues, and Handouts Education comprehension: verbalized understanding, returned demonstration, verbal cues required, tactile cues required, and needs further education    HOME EXERCISE PROGRAM: Access Code: [email or web address] URL: [email or web address] Date: 04/06/2024 Prepared by: Corean Ku  Exercises - Toe Yoga - Alternating Great Toe and Lesser Toe Extension  - 2-3 x daily - 7 x weekly - 1 sets - 10 reps - Standing Gastroc Stretch  - 2-3 x daily - 7 x weekly - 1 sets - 1-3 reps - 30sec hold - Soleus Stretch on Wall  - 2-3 x daily - 7 x weekly - 1 sets - 1-3 reps - 30 sec hold - Seated Ankle Eversion with Resistance  - 2 x daily - 7 x weekly - 2 sets - 20 reps - Seated Ankle Dorsiflexion with Resistance (Mirrored)  - 2 x daily - 7 x weekly - 2 sets - 20 reps - Standing Ankle Dorsiflexion Self-Mobilization on Chair With Band  - 1 x daily - 7 x weekly - 1 sets - 5 reps - 15-20 sec hold  ASSESSMENT:  CLINICAL IMPRESSION: Pt continues to report good improvement in pain and anticipate d/c next visit.     OBJECTIVE IMPAIRMENTS: Abnormal gait, decreased activity tolerance, decreased endurance, decreased mobility, difficulty walking, decreased ROM, decreased strength, and pain.   ACTIVITY LIMITATIONS: carrying, standing, squatting, stairs, and locomotion level  PARTICIPATION LIMITATIONS: meal prep, cleaning, laundry, community activity, and occupation  PERSONAL FACTORS: Time since onset of injury/illness/exacerbation and 1 comorbidity: anxiety are also affecting patient's functional outcome.   REHAB POTENTIAL: Good  CLINICAL  DECISION MAKING: Stable/uncomplicated  EVALUATION COMPLEXITY: Low   GOALS: Goals reviewed with patient? Yes  SHORT TERM GOALS: Target date: 03/11/2024   Independent with initial HEP Goal status: MET 04/06/24    LONG TERM GOALS: Target date: 05/18/2024  Independent with  final HEP Goal status: ONGOING 04/06/24  2.  PSFS score improved by 2 points for improved function Goal status: ONGOING 04/06/24  3.  Lt plantarflexion strength improved to 5/5 for improved function Goal status: ONGOING 04/06/24  4.  Report pain < 4/10 with standing and walking for work events for improved function Goal status: ONGOING 04/06/24  5.  Lt ankle dorsiflexion AROM improved to neutral for improved mobility Goal status: ONGOING 04/06/24       PLAN:  PT FREQUENCY: 1-2x/week  PT DURATION: 8 weeks  PLANNED INTERVENTIONS: Therapeutic exercises, Therapeutic activity, Neuromuscular re-education, Balance training, Gait training, Patient/Family education, Self Care, Joint mobilization, Joint manipulation, Stair training, Aquatic Therapy, Dry Needling, Electrical stimulation, Cryotherapy, Moist heat, Taping, Manual therapy, and Re-evaluation  PLAN FOR NEXT SESSION:  plan to d/c or hold next visit,  Review/update HEP PRN. Work on [employer] exercises as appropriate with emphasis on ankle DF ROM, calf strength, and foot intrinsic/extrinsic coordination in open and closed chain. Symptom modification strategies as indicated/appropriate.   Assess response to DN   Corean JULIANNA Ku, PT, DPT 04/20/24 10:11 AM

## 2024-04-25 ENCOUNTER — Other Ambulatory Visit: Payer: Self-pay | Admitting: Family Medicine

## 2024-04-25 DIAGNOSIS — Z1231 Encounter for screening mammogram for malignant neoplasm of breast: Secondary | ICD-10-CM

## 2024-04-27 ENCOUNTER — Ambulatory Visit: Admitting: Physical Therapy

## 2024-04-27 ENCOUNTER — Encounter: Payer: Self-pay | Admitting: Physical Therapy

## 2024-04-27 DIAGNOSIS — R2681 Unsteadiness on feet: Secondary | ICD-10-CM | POA: Diagnosis not present

## 2024-04-27 DIAGNOSIS — M6281 Muscle weakness (generalized): Secondary | ICD-10-CM | POA: Diagnosis not present

## 2024-04-27 DIAGNOSIS — M25672 Stiffness of left ankle, not elsewhere classified: Secondary | ICD-10-CM | POA: Diagnosis not present

## 2024-04-27 DIAGNOSIS — M25572 Pain in left ankle and joints of left foot: Secondary | ICD-10-CM | POA: Diagnosis not present

## 2024-04-27 NOTE — Therapy (Signed)
 OUTPATIENT PHYSICAL THERAPY TREATMENT    Patient Name: Erika Mullins MRN: [id number] DOB:01-18-1976, 48 y.o., female Today's Date: 04/27/2024  END OF SESSION:  PT End of Session - 04/27/24 0935     Visit Number 4    Number of Visits 7    Date for PT Re-Evaluation 05/18/24    Authorization Type [employer] $25 copay    PT Start Time 0933    PT Stop Time 1005    PT Time Calculation (min) 32 min    Activity Tolerance Patient tolerated treatment well    Behavior During Therapy [facility] for tasks assessed/performed              Past Medical History:  Diagnosis Date   Adult acne    Allergy    Dermatitis due to solar radiation 07/21/2023   Dysfunctional uterine bleeding    Obesity    Past Surgical History:  Procedure Laterality Date   CARPAL TUNNEL RELEASE  09/2010   Right   COLONOSCOPY N/A 03/10/2024   Procedure: COLONOSCOPY;  Surgeon: Abran Norleen SAILOR, MD;  Location: [facility];  Service: Gastroenterology;  Laterality: N/A;   TONSILLECTOMY AND ADENOIDECTOMY     TYMPANOSTOMY TUBE PLACEMENT     bilateral   Patient Active Problem List   Diagnosis Date Noted   Benign neoplasm of cecum 03/10/2024   Benign neoplasm of ascending colon 03/10/2024   Screen for colon cancer 01/12/2024   Plantar fasciitis 11/28/2022   Reactive thrombocytosis 09/17/2021   Heel pain, chronic, left 09/05/2021   Contusion of right knee 09/20/2020   Prediabetes 08/14/2020   Mixed hyperlipidemia 08/10/2020   Seasonal allergies 08/10/2020   Pain in right shoulder 08/07/2020   Carpal tunnel syndrome, bilateral 08/07/2020   H/O carpal tunnel repair 11/04/2018   Nasal injury 05/31/2018   Routine general medical examination at a health care facility 09/12/2014   Anxiety 09/17/2012   Class 3 severe obesity due to excess calories with body mass index (BMI) of 50.0 to 59.9 in adult 06/30/2011    PCP: Mercer Clotilda SAUNDERS, MD  REFERRING PROVIDER: Persons, Ronal Dragon, PA  REFERRING DIAG: [phone]  (ICD-10-CM) - Heel pain, chronic, left  THERAPY DIAG:  Stiffness of left ankle, not elsewhere classified  Pain in left ankle and joints of left foot  Muscle weakness (generalized)  Unsteadiness on feet  Rationale for Evaluation and Treatment: Rehabilitation  ONSET DATE: several years, exacerbation x 3 months  SUBJECTIVE:   SUBJECTIVE STATEMENT: Still doing well; feels ready to hold   PERTINENT HISTORY: prior carpal tunnel repair, anxiety  PAIN:  Are you having pain: none Location/description: L achilles Best-worst over past week: 0-5/10  Aggravating factors: morning pain, prolonged WB, standing/walking Easing factors: rest, footwear    PRECAUTIONS: None  WEIGHT BEARING RESTRICTIONS: No  FALLS:  Has patient fallen in last 6 months? No  LIVING ENVIRONMENT: 2 story home 4STE, 14 steps to bed/bath one rail, with dogs  OCCUPATION: works for city, TEFL teacher   PLOF: Independent  PATIENT GOALS: be able to walk more, be able to more consistently go to gym   NEXT MD VISIT: TBD   OBJECTIVE:   DIAGNOSTIC FINDINGS:  Refer to EPIC for imaging history  PATIENT SURVEYS:  Patient-Specific Activity Scoring Scheme  0 represents "unable to perform." 10 represents "able to perform at prior level. 0 1 2 3 4 5 6 7 8 9  10 (Date and Score)   Activity Eval   04/27/24  1. Exercise 4  5  2. Standing long periods-work events 4  5  Score 4 5   Total score = sum of the activity scores/number of activities Minimum detectable change (90%CI) for average score = 2 points Minimum detectable change (90%CI) for single activity score = 3 points     COGNITION: Overall cognitive status: Within functional limits for tasks assessed     SENSATION: No neuro complaints, appears grossly intact with exam today  EDEMA/OBSERVATION:  No observable edema on eval although she does endorse swelling in foot/ankle at times with increased activity    PALPATION: Concordant tenderness distal L achilles especially at calcaneal insertion, none throughout plantar fascia, heel, or gastroc soleus  LOWER EXTREMITY ROM:   Active ROM Right eval Left eval Left 04/27/24  Ankle dorsiflexion 5 deg  Active: lacking 10 deg Passive: 0 A: lacking 2 deg  Ankle plantarflexion     Ankle inversion 30 deg  30 deg   Ankle eversion 20 deg  A: 8 deg P: 14 deg    (Blank rows = not tested) (Key: WFL = within functional limits not formally assessed, * = concordant pain, s = stiffness/stretching sensation, NT = not tested)  Comments:    LOWER EXTREMITY MMT   MMT Right eval Left eval Left 04/27/24  Knee flexion     Knee extension     Ankle dorsiflexion  5   Ankle plantarflexion   4 (no pain this morning) 4 (able to complete 11)  Ankle inversion  5   Ankle eversion  5    (Blank rows = not tested) (Key: WFL = within functional limits not formally assessed, * = concordant pain, s = stiffness/stretching sensation, NT = not tested)  Comments:     GAIT: Distance walked: within clinic Assistive device utilized: None Level of assistance: Complete Independence Comments: noted increase in pronation, mildly widened BOS and increased forward pelvic rotation BIL with LE advancement   TODAY'S TREATMENT:                                                                                                                              04/27/24 Manual STM with compression to Lt soleus and gastroc; skilled palpation and monitoring of soft tissue during DN  Trigger Point Dry Needling  Subsequent Treatment: Instructions provided previously at initial dry needling treatment.  Patient Verbal Consent Given: Yes Education Handout Provided: Previously Provided Muscles Treated: Lt soleus, gastroc Electrical Stimulation Performed: Yes, Parameters: 10 mA freq with intensity to tolerance x 5 min Treatment Response/Outcome: twitch responses noted  TherEx ROM and MMT - see  above for details  04/20/24 Manual STM with compression to Lt soleus and gastroc; skilled palpation and monitoring of soft tissue during DN  Trigger Point Dry Needling  Subsequent Treatment: Instructions provided previously at initial dry needling treatment.  Patient Verbal Consent Given: Yes Education Handout Provided: Previously Provided Muscles Treated: Lt soleus, gastroc Electrical Stimulation Performed: Yes, Parameters: 10 mA freq  with intensity to tolerance x 5 min Treatment Response/Outcome: twitch responses noted   04/06/24 Manual STM with compression to Lt soleus and gastroc; skilled palpation and monitoring of soft tissue during DN  Trigger Point Dry Needling  Initial Treatment: Pt instructed on Dry Needling rational, procedures, and possible side effects. Pt instructed to expect mild to moderate muscle soreness later in the day and/or into the next day.  Pt instructed in methods to reduce muscle soreness. Pt instructed to continue prescribed HEP. Patient was educated on signs and symptoms of infection and other risk factors and advised to seek medical attention should they occur.  Patient verbalized understanding of these instructions and education.   Patient Verbal Consent Given: Yes Education Handout Provided: Previously Provided Muscles Treated: Lt soleus Electrical Stimulation Performed: Yes, Parameters: 10 mA freq with intensity to tolerance x 5 min Treatment Response/Outcome: twitch responses noted  TherEx Lt ankle DF and eversion with L4 band x 10 reps each with mod cues for technique MWM to Lt ankle DF with L5 band with mod cues for technique   PATIENT EDUCATION:  Education details: Pt education on PT impairments, prognosis, and POC. Informed consent. Rationale for interventions, safe/appropriate HEP performance Person educated: Patient Education method: Explanation, Demonstration, Tactile cues, Verbal cues, and Handouts Education comprehension: verbalized  understanding, returned demonstration, verbal cues required, tactile cues required, and needs further education    HOME EXERCISE PROGRAM: Access Code: [email or web address] URL: [email or web address] Date: 04/06/2024 Prepared by: Corean Ku  Exercises - Toe Yoga - Alternating Great Toe and Lesser Toe Extension  - 2-3 x daily - 7 x weekly - 1 sets - 10 reps - Standing Gastroc Stretch  - 2-3 x daily - 7 x weekly - 1 sets - 1-3 reps - 30sec hold - Soleus Stretch on Wall  - 2-3 x daily - 7 x weekly - 1 sets - 1-3 reps - 30 sec hold - Seated Ankle Eversion with Resistance  - 2 x daily - 7 x weekly - 2 sets - 20 reps - Seated Ankle Dorsiflexion with Resistance (Mirrored)  - 2 x daily - 7 x weekly - 2 sets - 20 reps - Standing Ankle Dorsiflexion Self-Mobilization on Chair With Band  - 1 x daily - 7 x weekly - 1 sets - 5 reps - 15-20 sec hold  ASSESSMENT:  CLINICAL IMPRESSION: Pt has demonstrated progress towards her LTGs at this time, but not quite met at this time.  Plan to hold PT as she's pleased with her current progress and she will call if she needs to return.   OBJECTIVE IMPAIRMENTS: Abnormal gait, decreased activity tolerance, decreased endurance, decreased mobility, difficulty walking, decreased ROM, decreased strength, and pain.   ACTIVITY LIMITATIONS: carrying, standing, squatting, stairs, and locomotion level  PARTICIPATION LIMITATIONS: meal prep, cleaning, laundry, community activity, and occupation  PERSONAL FACTORS: Time since onset of injury/illness/exacerbation and 1 comorbidity: anxiety are also affecting patient's functional outcome.   REHAB POTENTIAL: Good  CLINICAL DECISION MAKING: Stable/uncomplicated  EVALUATION COMPLEXITY: Low   GOALS: Goals reviewed with patient? Yes  SHORT TERM GOALS: Target date: 03/11/2024   Independent with initial HEP Goal status: MET 04/06/24    LONG TERM GOALS: Target date: 05/18/2024  Independent with final HEP Goal  status: ONGOING 04/27/24  2.  PSFS score improved by 2 points for improved function Goal status: ONGOING 04/27/24  3.  Lt plantarflexion strength improved to 5/5 for improved function Goal status: ONGOING 04/27/24  4.  Report pain < 4/10  with standing and walking for work events for improved function Goal status: ONGOING 04/27/24  5.  Lt ankle dorsiflexion AROM improved to neutral for improved mobility Goal status: ONGOING 04/27/24       PLAN:  PT FREQUENCY: 1-2x/week  PT DURATION: 8 weeks  PLANNED INTERVENTIONS: Therapeutic exercises, Therapeutic activity, Neuromuscular re-education, Balance training, Gait training, Patient/Family education, Self Care, Joint mobilization, Joint manipulation, Stair training, Aquatic Therapy, Dry Needling, Electrical stimulation, Cryotherapy, Moist heat, Taping, Manual therapy, and Re-evaluation  PLAN FOR NEXT SESSION: hold PT, reassess if she comes back, continue DN/manual   Corean JULIANNA Ku, PT, DPT 04/27/24 10:16 AM

## 2024-05-02 ENCOUNTER — Ambulatory Visit
Admission: RE | Admit: 2024-05-02 | Discharge: 2024-05-02 | Disposition: A | Discharge status: Home / Self Care | Source: Ambulatory Visit | Attending: Family Medicine | Admitting: Family Medicine

## 2024-05-02 ENCOUNTER — Other Ambulatory Visit (HOSPITAL_BASED_OUTPATIENT_CLINIC_OR_DEPARTMENT_OTHER): Payer: Self-pay

## 2024-05-02 DIAGNOSIS — Z1231 Encounter for screening mammogram for malignant neoplasm of breast: Secondary | ICD-10-CM

## 2024-05-13 ENCOUNTER — Other Ambulatory Visit (HOSPITAL_BASED_OUTPATIENT_CLINIC_OR_DEPARTMENT_OTHER): Payer: Self-pay

## 2024-05-16 ENCOUNTER — Other Ambulatory Visit (HOSPITAL_BASED_OUTPATIENT_CLINIC_OR_DEPARTMENT_OTHER): Payer: Self-pay

## 2024-06-21 ENCOUNTER — Other Ambulatory Visit (HOSPITAL_BASED_OUTPATIENT_CLINIC_OR_DEPARTMENT_OTHER): Payer: Self-pay

## 2024-06-27 ENCOUNTER — Other Ambulatory Visit (HOSPITAL_BASED_OUTPATIENT_CLINIC_OR_DEPARTMENT_OTHER): Payer: Self-pay

## 2024-07-24 ENCOUNTER — Encounter: Payer: Self-pay | Admitting: Family Medicine

## 2024-08-02 ENCOUNTER — Other Ambulatory Visit (HOSPITAL_BASED_OUTPATIENT_CLINIC_OR_DEPARTMENT_OTHER): Payer: Self-pay

## 2024-08-03 ENCOUNTER — Other Ambulatory Visit (HOSPITAL_BASED_OUTPATIENT_CLINIC_OR_DEPARTMENT_OTHER): Payer: Self-pay

## 2024-08-03 ENCOUNTER — Telehealth (INDEPENDENT_AMBULATORY_CARE_PROVIDER_SITE_OTHER): Admitting: Family Medicine

## 2024-08-03 DIAGNOSIS — L709 Acne, unspecified: Secondary | ICD-10-CM | POA: Diagnosis not present

## 2024-08-03 MED ORDER — CLINDAMYCIN PHOSPHATE 1 % EX SOLN
Freq: Two times a day (BID) | CUTANEOUS | 0 refills | Status: AC
  Filled 2024-08-03: qty 60, 30d supply, fill #0

## 2024-08-03 NOTE — Telephone Encounter (Signed)
Pt seen for this concern.

## 2024-08-03 NOTE — Progress Notes (Signed)
 Virtual Visit via Video Note  I connected with Erika Mullins on 08/03/24 at  8:30 AM EDT by a video enabled telemedicine application and verified that I am speaking with the correct person using two identifiers.  Location patient: home Location provider:work or home office Persons participating in the virtual visit: patient, provider  I discussed the limitations of evaluation and management by telemedicine and the availability of in person appointments. The patient expressed understanding and agreed to proceed. Chief Complaint  Patient presents with   Belepharitis    Left eye. Symptoms began 2 weeks ago.      HPI: Pt is a 48 yo female seen for acute concern.  Pt with a red rough spot under L eye x 2 wks.  Area is not painful, without pruritus, numbness or tingling.  No changes in vision or new lesions.  The area has become slightly larger in size and now has a pimple on it.  Pt has another pimple on L upper eyelid near brow line.  UsingCeravae face wash daily.  Cervae moisturizer.  Endorses more acne lately.    Just returned from vacation.   ROS: See pertinent positives and negatives per HPI.  Past Medical History:  Diagnosis Date   Adult acne    Allergy    Dermatitis due to solar radiation 07/21/2023   Dysfunctional uterine bleeding    Obesity     Past Surgical History:  Procedure Laterality Date   CARPAL TUNNEL RELEASE  09/2010   Right   COLONOSCOPY N/A 03/10/2024   Procedure: COLONOSCOPY;  Surgeon: Abran Norleen SAILOR, MD;  Location: [facility];  Service: Gastroenterology;  Laterality: N/A;   TONSILLECTOMY AND ADENOIDECTOMY     TYMPANOSTOMY TUBE PLACEMENT     bilateral    Family History  Problem Relation Age of Onset   Hyperlipidemia Father    Heart disease Father    Obesity Father    Stroke Maternal Grandmother    Cancer Maternal Grandfather        Pancreatic   Hyperlipidemia Maternal Grandfather    Heart attack Maternal Grandfather    Hyperlipidemia Other     Hypertension Other    Obesity Other    Breast cancer Neg Hx    Colon cancer Neg Hx    Esophageal cancer Neg Hx    Stomach cancer Neg Hx    Rectal cancer Neg Hx      Current Outpatient Medications:    cetirizine  (ZYRTEC ) 10 MG tablet, Take 1 tablet (10 mg total) by mouth daily., Disp: 100 tablet, Rfl: 4   cholecalciferol (VITAMIN D3) 25 MCG (1000 UNIT) tablet, Take 1,000 Units by mouth daily., Disp: , Rfl:    citalopram  (CELEXA ) 40 MG tablet, Take 1 tablet (40 mg total) by mouth daily., Disp: 90 tablet, Rfl: 3   fluticasone  (FLONASE ) 50 MCG/ACT nasal spray, Place 1 spray into both nostrils daily., Disp: 16 g, Rfl: 5   ibuprofen (ADVIL,MOTRIN) 200 MG tablet, Take 600-800 mg by mouth as needed for pain., Disp: , Rfl:    loperamide (IMODIUM) 2 MG capsule, Take 2 mg by mouth 2 (two) times daily as needed for diarrhea or loose stools., Disp: , Rfl:    norethindrone -ethinyl estradiol  (NORTREL  7/7/7) 0.5/0.75/1-35 MG-MCG tablet, Take 1 tablet by mouth daily., Disp: 84 tablet, Rfl: 3   nystatin  cream (MYCOSTATIN ), Apply 1 Application topically 2 (two) times daily., Disp: 30 g, Rfl: 0   vitamin B-12 (CYANOCOBALAMIN ) 100 MCG tablet, Take 100 mcg by mouth daily., Disp: ,  Rfl:    diclofenac (VOLTAREN) 75 MG EC tablet, , Disp: , Rfl:    montelukast  (SINGULAIR ) 10 MG tablet, Take 1 tablet (10 mg total) by mouth at bedtime. (Patient not taking: Reported on 08/03/2024), Disp: 30 tablet, Rfl: 3  EXAM:  VITALS per patient if applicable: RR between 12-20 bpm  GENERAL: alert, oriented, appears well and in no acute distress  HEENT: atraumatic, conjunctiva clear, no obvious abnormalities on inspection of external nose and ears  NECK: normal movements of the head and neck  LUNGS: on inspection no signs of respiratory distress, breathing rate appears normal, no obvious gross SOB, gasping or wheezing  CV: no obvious cyanosis  SKIN:  A 2-3 mm pustule inferior to L eye with erythema surrounding.  A smaller  pustule of superior L upper eye lid medially near brow line.  MS: moves all visible extremities without noticeable abnormality  PSYCH/NEURO: pleasant and cooperative, no obvious depression or anxiety, speech and thought processing grossly intact  ASSESSMENT AND PLAN:  Discussed the following assessment and plan:  Adult acne - Plan: clindamycin (CLEOCIN T) 1 % external solution  Acute pustules above and below left eye.  Discussed limitations via virtual visit.  Given appearance likely acne.  Given location and appearance also consider shingles, though less likely as lesions do not appear in varying stages.  Discussed supportive care including warm compresses, cleansing face with a gentle cleanser twice a day and using a gentle moisturizer.  If wearing make-up cleaning brushes, replacing products frequently, and cleansing face to remove products.  Will start clindamycin lotion twice daily.  Follow-up for continued or worsening symptoms.   I discussed the assessment and treatment plan with the patient. The patient was provided an opportunity to ask questions and all were answered. The patient agreed with the plan and demonstrated an understanding of the instructions.   The patient was advised to call back or seek an in-person evaluation if the symptoms worsen or if the condition fails to improve as anticipated.   Clotilda JONELLE Single, MD

## 2024-08-22 ENCOUNTER — Encounter: Payer: Self-pay | Admitting: Radiology

## 2024-09-01 ENCOUNTER — Encounter: Payer: Self-pay | Admitting: Family Medicine

## 2024-09-06 ENCOUNTER — Other Ambulatory Visit (HOSPITAL_BASED_OUTPATIENT_CLINIC_OR_DEPARTMENT_OTHER): Payer: Self-pay

## 2024-09-13 ENCOUNTER — Other Ambulatory Visit (HOSPITAL_BASED_OUTPATIENT_CLINIC_OR_DEPARTMENT_OTHER): Payer: Self-pay
